# Patient Record
Sex: Female | Born: 1955
Health system: Southern US, Community
[De-identification: ages and names within clinical notes are randomized; demographics above are authoritative.]

## PROBLEM LIST (undated history)

## (undated) DIAGNOSIS — K589 Irritable bowel syndrome without diarrhea: Secondary | ICD-10-CM

## (undated) DIAGNOSIS — I341 Nonrheumatic mitral (valve) prolapse: Secondary | ICD-10-CM

## (undated) DIAGNOSIS — G43909 Migraine, unspecified, not intractable, without status migrainosus: Secondary | ICD-10-CM

## (undated) DIAGNOSIS — H8109 Meniere's disease, unspecified ear: Secondary | ICD-10-CM

## (undated) DIAGNOSIS — T7840XA Allergy, unspecified, initial encounter: Secondary | ICD-10-CM

## (undated) HISTORY — DX: Irritable bowel syndrome, unspecified: K58.9

## (undated) HISTORY — DX: Migraine, unspecified, not intractable, without status migrainosus: G43.909

## (undated) HISTORY — DX: Allergy, unspecified, initial encounter: T78.40XA

## (undated) HISTORY — DX: Nonrheumatic mitral (valve) prolapse: I34.1

## (undated) HISTORY — PX: BREAST SURGERY: SHX581

## (undated) HISTORY — DX: Meniere's disease, unspecified ear: H81.09

## (undated) HISTORY — PX: BREAST CYST ASPIRATION: SHX578

---

## 1997-07-08 HISTORY — PX: CHOLECYSTECTOMY: SHX55

## 2002-07-08 HISTORY — PX: COLONOSCOPY: SHX174

## 2004-05-10 ENCOUNTER — Ambulatory Visit: Payer: Self-pay | Admitting: Family Medicine

## 2004-08-09 ENCOUNTER — Ambulatory Visit: Payer: Self-pay | Admitting: Unknown Physician Specialty

## 2004-08-14 ENCOUNTER — Ambulatory Visit: Payer: Self-pay | Admitting: Unknown Physician Specialty

## 2005-09-18 ENCOUNTER — Ambulatory Visit: Payer: Self-pay | Admitting: General Surgery

## 2006-07-23 ENCOUNTER — Ambulatory Visit: Payer: Self-pay | Admitting: Unknown Physician Specialty

## 2007-09-03 ENCOUNTER — Ambulatory Visit: Payer: Self-pay | Admitting: Unknown Physician Specialty

## 2009-08-17 ENCOUNTER — Ambulatory Visit: Payer: Self-pay | Admitting: Unknown Physician Specialty

## 2010-08-20 ENCOUNTER — Ambulatory Visit: Payer: Self-pay | Admitting: Unknown Physician Specialty

## 2011-09-30 ENCOUNTER — Ambulatory Visit: Payer: Self-pay | Admitting: Family Medicine

## 2012-07-08 HISTORY — PX: BREAST EXCISIONAL BIOPSY: SUR124

## 2012-09-23 ENCOUNTER — Ambulatory Visit: Payer: Self-pay | Admitting: Family Medicine

## 2013-01-17 DIAGNOSIS — N63 Unspecified lump in unspecified breast: Secondary | ICD-10-CM | POA: Insufficient documentation

## 2013-02-05 ENCOUNTER — Ambulatory Visit: Payer: Self-pay | Admitting: Family Medicine

## 2013-02-08 ENCOUNTER — Encounter: Payer: Self-pay | Admitting: *Deleted

## 2013-02-17 ENCOUNTER — Ambulatory Visit (INDEPENDENT_AMBULATORY_CARE_PROVIDER_SITE_OTHER): Payer: BC Managed Care – PPO | Admitting: General Surgery

## 2013-02-17 ENCOUNTER — Encounter: Payer: Self-pay | Admitting: General Surgery

## 2013-02-17 VITALS — BP 100/72 | HR 76 | Resp 14 | Ht 66.0 in | Wt 143.0 lb

## 2013-02-17 DIAGNOSIS — N63 Unspecified lump in unspecified breast: Secondary | ICD-10-CM

## 2013-02-17 NOTE — Patient Instructions (Addendum)
Continue self breast exams. Call office for any new breast issues or concerns.  The left breast mass excision procedure was reviewed with the patient. The potential for bleeding, infection, and pain was reviewed. At this time, the benefits outweigh the risk and the patient is amenable to proceed. Patient to decide if office procedure or outpatient day surgery.  Discussed having a routine colonoscopy, last one was 2004.  Patient will call the office with decision regarding office excision versus having [facility] left breast mass excision.

## 2013-02-17 NOTE — Progress Notes (Signed)
Patient ID: Jill Ward, female   DOB: 1956-04-15, 57 y.o.   MRN: [id number]  Chief Complaint  Patient presents with  . Breast Problem    abnormal mammogram    HPI Jill Ward is a 57 y.o. female.  who presents for a breast evaluation. The most recent mammogram and ultrasound was done on 02-05-13.  Patient does perform regular self breast checks and gets regular mammograms done.  Family history of breast and ovarian cancer. States she has noticed left breast nipple discharge yellowish in color for about one month and has noticed a lump. States she has been genetically testing and is awaiting on results. She denies pain and injury.  No nipple oral contact.  HPI  Past Medical History  Diagnosis Date  . Irritable bowel syndrome (IBS)     Past Surgical History  Procedure Laterality Date  . Cholecystectomy  1999  . Breast surgery    . Breast cyst aspiration    . Colonoscopy      Dr Lemar Livings    Family History  Problem Relation Age of Onset  . Cancer Mother     bladder  . Dementia Father   . Crohn's disease Mother   . Breast cancer Maternal Aunt 40  . Ovarian cancer Maternal Aunt 77    Social History History  Substance Use Topics  . Smoking status: Never Smoker   . Smokeless tobacco: Not on file  . Alcohol Use: No    Allergies  Allergen Reactions  . Codeine Nausea Only    Current Outpatient Prescriptions  Medication Sig Dispense Refill  . Probiotic Product (PROBIOTIC DAILY PO) Take 1 tablet by mouth daily.      . citalopram (CELEXA) 10 MG tablet Take 10 mg by mouth daily.        No current facility-administered medications for this visit.    Review of Systems Review of Systems  Constitutional: Negative.   Respiratory: Negative.   Cardiovascular: Negative.     Blood pressure 100/72, pulse 76, resp. rate 14, height 5\' 6"  (1.676 m), weight 143 lb (64.864 kg).  Physical Exam Physical Exam  Constitutional: She is oriented to person, place, and time. She  appears well-developed and well-nourished.  Neck: Neck supple.  Cardiovascular: Normal rate and regular rhythm.   Pulmonary/Chest: Effort normal and breath sounds normal. Right breast exhibits no inverted nipple, no mass, no nipple discharge, no skin change and no tenderness. Left breast exhibits mass (5mm nodule base of nipple with single drop clear drainage). Left breast exhibits no inverted nipple, no nipple discharge (single drop of clear yellow drainage, single duct.), no skin change and no tenderness.  Lymphadenopathy:    She has no cervical adenopathy.    She has no axillary adenopathy.  Neurological: She is alert and oriented to person, place, and time.  Skin: Skin is warm and dry.      Data Reviewed Left breast mammogram dated 02/05/2013 showed prominent retroareolar duct. Ultrasound completed the same date suggested a 3 mm solid nodule at 11:00 which could be located within the duct. [id number].  Ductal fluid cytology dated 02/03/2013 showed negative for malignant cells. Foam cells and proteinaceous material was noted.  Focused ultrasound examination of the nodular area showed a cystlike structure, possibly associated with a adjacent ductal structure.  Assessment    Prominent retroareolar ductal tissue in the upper inner quadrant left breast. Mammographic abnormality correlates with clinical exam.    Plan    Local excision of this area  is recommended. As can be completed as an office procedure or under anesthesia in the hospital as an outpatient.  Excision has been recommended to clarify the diagnosis. The patient is to say the least somewhat anxious about the palpable nodule and the new drainage.  The patient's last colonoscopy was in 2004. A screening studies indicated. She reports that her IBS symptoms are best when she make use of a probiotic. She was encouraged to use his daily.     Patient will call the office with decision regarding office excision versus having [facility]  left breast mass excision.  Dorathy Daft M 02/17/2013, 9:19 AM

## 2013-02-22 ENCOUNTER — Telehealth: Payer: Self-pay

## 2013-02-22 NOTE — Telephone Encounter (Signed)
Patient returned call. She was notified of the need to sign consent and her prescription that Dr Lemar Livings wrote for her. Patient to come in tomorrow to sign consent and pick up her prescription.

## 2013-02-22 NOTE — Telephone Encounter (Signed)
Left msg for patient to call back

## 2013-02-22 NOTE — Telephone Encounter (Signed)
Message copied by Sinda Du on Mon Feb 22, 2013  1:35 PM ------      Message from: Poole, Merrily Pew      Created: Sun Feb 21, 2013  5:43 PM      Regarding: RE: Pre excision/biopsy medication       Patient will need to come in to sign consent prior to procedure. She can pick up RX for valium tablet to be taken one hour prior to the procedure at that time. RX in out box.       ----- Message -----         From: Sinda Du, LPN         Sent: 02/18/2013   3:24 PM           To: Earline Mayotte, MD      Subject: Pre excision/biopsy medication                           Alee called today to schedule her in office breast mass excision/biopsy for Monday the 25th. She was last seen on 02/17/13. She wanted to know if she could get some medication called in for anxiety/nervousness prior to her procedure. She said she would only need 2 or 3 low doses of either valium or xanax so she could be more relaxed that day. She will have someone driving her that day. I told her I would ask you and if there was any problems with that I would give her a call back.       ------

## 2013-03-01 ENCOUNTER — Ambulatory Visit (INDEPENDENT_AMBULATORY_CARE_PROVIDER_SITE_OTHER): Payer: BC Managed Care – PPO | Admitting: General Surgery

## 2013-03-01 ENCOUNTER — Encounter: Payer: Self-pay | Admitting: General Surgery

## 2013-03-01 VITALS — BP 122/78 | HR 77 | Resp 13 | Ht 66.0 in | Wt 141.0 lb

## 2013-03-01 DIAGNOSIS — N63 Unspecified lump in unspecified breast: Secondary | ICD-10-CM

## 2013-03-01 DIAGNOSIS — N6320 Unspecified lump in the left breast, unspecified quadrant: Secondary | ICD-10-CM

## 2013-03-01 NOTE — Patient Instructions (Addendum)
CARE AFTER BREAST  Excision  1. Leave the dressing on that your doctor applied after surgery. It is waterproof. You may bathe, shower and/or swim. The dressing will probably remain intact until your return office visit. If the dressing comes off, you will see small strips of tape against your skin on the incision. Do not remove these strips.  2. You may want to use a gauze,cloth or similar protection in your bra to prevent rubbing against your dressing and incision. This is not necessary, but you may feel more comfortable doing so.  3. It is recommended that you wear a bra day and night to give support to the breast. This will prevent the weight of the breast from pulling on the incision.  4. Your breast will feel hard and lumpy under the incision. Do not be alarmed. This is the underlying stitching of tissue. Softening of this tissue will occur in time.  5. Make sure you call the office and schedule an appointment in one week after your surgery. The office phone number is [phone]. The [facility] may have already done this for you.  6. You will notice about a week after your office visit that the strips of the tape on your incision will begin to loosen. These may then be removed.  7. Report to your doctor any of the following:  * Severe pain not relieved by your pain medication  *Redness of the incision  * Drainage from the incision  *Fever greater than 101 degrees 

## 2013-03-01 NOTE — Progress Notes (Signed)
Patient ID: Jill Ward, female   DOB: 1956-02-15, 57 y.o.   MRN: [id number]  Chief Complaint  Patient presents with  . Other    excision of L breast mass    HPI Jill Ward is a 57 y.o. female  Here for scheduled excision of left breast mass. The patient had made use of a 10 mg Valium tablet prior to the procedure. Informed consent had been obtained earlier this month. She did have a driver. The area of concern was confirmed by the patient in the upper inner area of the left retroareolar tissue. HPI  Past Medical History  Diagnosis Date  . Irritable bowel syndrome (IBS)     Past Surgical History  Procedure Laterality Date  . Cholecystectomy  1999  . Breast surgery    . Breast cyst aspiration    . Colonoscopy  2004    Dr Lemar Livings    Family History  Problem Relation Age of Onset  . Cancer Mother     bladder  . Dementia Father   . Crohn's disease Mother   . Breast cancer Maternal Aunt 40  . Ovarian cancer Maternal Aunt 77    Social History History  Substance Use Topics  . Smoking status: Never Smoker   . Smokeless tobacco: Not on file  . Alcohol Use: No    Allergies  Allergen Reactions  . Codeine Nausea Only    Current Outpatient Prescriptions  Medication Sig Dispense Refill  . citalopram (CELEXA) 10 MG tablet Take 10 mg by mouth daily.       . Probiotic Product (PROBIOTIC DAILY PO) Take 1 tablet by mouth daily.       No current facility-administered medications for this visit.    Review of Systems Review of Systems  Blood pressure 122/78, pulse 77, resp. rate 13, height 5\' 6"  (1.676 m), weight 141 lb (63.957 kg).  Physical Exam Physical Exam Examination showed a palpable area about 5 mm in diameter just at the edge of the areola. This was confirmed with ultrasound. Data Reviewed No new data.  Assessment    Left breast mass. Possible ductal dilatation.     Plan    The area was prepped with alcohol and 10 cc of 0.5% Xylocaine with 0.25%  Marcaine with 1-200,000 of epinephrine was utilized a well-tolerated. Chlor prep was applied to the skin. A small radial incision was made from the base of the nipple to the edge of the areola. The nodular tissue was excised sharply. The deep tissue was approximated with 3-0 Vicryl. The skin was approximated with interrupted 3-0 Vicryl subcutaneous tissue sutures. Scant bleeding was noted. The wound was closed with benzoin and Steri-Strips followed by Telfa and Tegaderm dressing. The procedure was well-tolerated.        Earline Mayotte 03/01/2013, 9:44 PM

## 2013-03-03 ENCOUNTER — Telehealth: Payer: Self-pay | Admitting: *Deleted

## 2013-03-03 LAB — PATHOLOGY

## 2013-03-03 NOTE — Telephone Encounter (Signed)
Notified patient as instructed, no cancer per Dr. Lemar Livings, patient pleased. Discussed follow-up appointments nurse next week, patient agrees

## 2013-03-09 ENCOUNTER — Ambulatory Visit (INDEPENDENT_AMBULATORY_CARE_PROVIDER_SITE_OTHER): Payer: BC Managed Care – PPO | Admitting: *Deleted

## 2013-03-09 DIAGNOSIS — N63 Unspecified lump in unspecified breast: Secondary | ICD-10-CM

## 2013-03-09 DIAGNOSIS — N6320 Unspecified lump in the left breast, unspecified quadrant: Secondary | ICD-10-CM | POA: Insufficient documentation

## 2013-03-09 NOTE — Progress Notes (Signed)
Steri strips in place. Some bruising present. No complaints at this time from patient. Patient advised of pathology results.

## 2014-05-09 ENCOUNTER — Encounter: Payer: Self-pay | Admitting: General Surgery

## 2014-07-08 HISTORY — PX: OTHER SURGICAL HISTORY: SHX169

## 2015-02-22 ENCOUNTER — Encounter (INDEPENDENT_AMBULATORY_CARE_PROVIDER_SITE_OTHER): Payer: Self-pay

## 2015-02-22 ENCOUNTER — Ambulatory Visit (INDEPENDENT_AMBULATORY_CARE_PROVIDER_SITE_OTHER): Payer: BLUE CROSS/BLUE SHIELD | Admitting: Nurse Practitioner

## 2015-02-22 ENCOUNTER — Encounter: Payer: Self-pay | Admitting: Nurse Practitioner

## 2015-02-22 VITALS — BP 120/78 | HR 78 | Temp 98.6°F | Resp 16 | Ht 66.0 in | Wt 151.6 lb

## 2015-02-22 DIAGNOSIS — G43809 Other migraine, not intractable, without status migrainosus: Secondary | ICD-10-CM | POA: Diagnosis not present

## 2015-02-22 DIAGNOSIS — Z91048 Other nonmedicinal substance allergy status: Secondary | ICD-10-CM | POA: Diagnosis not present

## 2015-02-22 DIAGNOSIS — Z9109 Other allergy status, other than to drugs and biological substances: Secondary | ICD-10-CM

## 2015-02-22 DIAGNOSIS — K589 Irritable bowel syndrome without diarrhea: Secondary | ICD-10-CM

## 2015-02-22 DIAGNOSIS — C801 Malignant (primary) neoplasm, unspecified: Secondary | ICD-10-CM

## 2015-02-22 DIAGNOSIS — Z7189 Other specified counseling: Secondary | ICD-10-CM | POA: Diagnosis not present

## 2015-02-22 DIAGNOSIS — Z7689 Persons encountering health services in other specified circumstances: Secondary | ICD-10-CM

## 2015-02-22 DIAGNOSIS — IMO0002 Reserved for concepts with insufficient information to code with codable children: Secondary | ICD-10-CM

## 2015-02-22 NOTE — Progress Notes (Signed)
Patient ID: Jill Ward, female    DOB: 1955/11/12  Age: 59 y.o. MRN: [id number]  CC: Establish Care   HPI ASENCION GUISINGER presents for establishing care and CC of swollen eyes and stopped up ears.   1) New pt info:   Immunizations- Tdap 2015; Zostavax in 2014  Mammogram- 08/2014   Pap- 2/16   Colonoscopy- 10 years ago, has IBS   Eye Exam- 02/27/15   2) Chronic Problems-  Allergies- Claritin OTC during spring   Migraines- Stress induced; last episode 2 years ago   IBS- diarrhea: Probiotic helpful (Colon health)   Squamous cell- on chest; removed   Dermatology in [place]. [place] Dr. Wylene Simmer   3) Acute Problems-   Filmy all day she reports, leaking, ongoing for weeks,   Alivert and eye drops   Warm compresses- not helpful   Ears are stopped up   History Rachana has a past medical history of Irritable bowel syndrome (IBS); Allergy; and Migraines.   She has past surgical history that includes Cholecystectomy (1999); Breast surgery; Breast cyst aspiration; Colonoscopy (2004); and Sqamous Cancer Cell (2016).   Her family history includes Breast cancer (age of onset: 42) in her maternal aunt; Cancer (age of onset: 30) in her mother; Crohn's disease in her mother; Dementia in her father; Heart disease (age of onset: 89) in her father; Ovarian cancer (age of onset: 67) in her maternal aunt.She reports that she has never smoked. She has never used smokeless tobacco. She reports that she does not drink alcohol or use illicit drugs.  Outpatient Prescriptions Prior to Visit  Medication Sig Dispense Refill  . citalopram (CELEXA) 10 MG tablet Take 10 mg by mouth daily.     . Probiotic Product (PROBIOTIC DAILY PO) Take 1 tablet by mouth daily.     No facility-administered medications prior to visit.    ROS Review of Systems  Constitutional: Negative for fever, chills, diaphoresis and fatigue.  HENT: Negative for ear discharge and ear pain.        Ears feel congested  Eyes: Positive for  discharge. Negative for photophobia, pain, redness, itching and visual disturbance.  Respiratory: Negative for chest tightness, shortness of breath and wheezing.   Cardiovascular: Negative for chest pain, palpitations and leg swelling.  Gastrointestinal: Negative for nausea, vomiting and diarrhea.  Skin: Negative for rash.  Neurological: Negative for dizziness, weakness, numbness and headaches.  Psychiatric/Behavioral: The patient is not nervous/anxious.     Objective:  BP 120/78 mmHg  Pulse 78  Temp(Src) 98.6 F (37 C)  Resp 16  Ht 5\' 6"  (1.676 m)  Wt 151 lb 9.6 oz (68.765 kg)  BMI 24.48 kg/m2  SpO2 95%  Physical Exam  Constitutional: She is oriented to person, place, and time. She appears well-developed and well-nourished. No distress.  HENT:  Head: Normocephalic and atraumatic.  Right Ear: External ear normal.  Left Ear: External ear normal.  Eyes: Conjunctivae and EOM are normal. Pupils are equal, round, and reactive to light. Right eye exhibits no discharge. Left eye exhibits no discharge. No scleral icterus.  No visible significant findings; left top eyelid has small round flesh colored lesion  Neck: Normal range of motion. Neck supple.  Cardiovascular: Normal rate, regular rhythm and normal heart sounds.   Pulmonary/Chest: Effort normal and breath sounds normal. No respiratory distress. She has no wheezes. She has no rales. She exhibits no tenderness.  Lymphadenopathy:    She has no cervical adenopathy.  Neurological: She is alert and  oriented to person, place, and time. No cranial nerve deficit. She exhibits normal muscle tone. Coordination normal.  Skin: Skin is warm and dry. No rash noted. She is not diaphoretic.  Psychiatric: She has a normal mood and affect. Her behavior is normal. Judgment and thought content normal.   Assessment & Plan:   Briany was seen today for establish care.  Diagnoses and all orders for this visit:  Environmental allergies  Encounter  to establish care  IBS (irritable bowel syndrome)  Other migraine without status migrainosus, not intractable  Squamous cell carcinoma  I am having Ms. Rae maintain her citalopram, Probiotic Product (PROBIOTIC DAILY PO), and cholecalciferol.  Meds ordered this encounter  Medications  . cholecalciferol (VITAMIN D) 1000 UNITS tablet    Sig: Take 1,000 Units by mouth daily.     Follow-up: Return in about 6 months (around 08/25/2015) for Follow up.

## 2015-02-22 NOTE — Patient Instructions (Signed)
Please use flonase 2 sprays in each nostril once daily   Welcome to [facility]! Call us next week if not helpful.  Follow up in 6 months.

## 2015-02-23 DIAGNOSIS — K589 Irritable bowel syndrome without diarrhea: Secondary | ICD-10-CM | POA: Insufficient documentation

## 2015-02-23 DIAGNOSIS — IMO0002 Reserved for concepts with insufficient information to code with codable children: Secondary | ICD-10-CM | POA: Insufficient documentation

## 2015-02-23 DIAGNOSIS — Z0000 Encounter for general adult medical examination without abnormal findings: Secondary | ICD-10-CM | POA: Insufficient documentation

## 2015-02-23 DIAGNOSIS — Z7689 Persons encountering health services in other specified circumstances: Secondary | ICD-10-CM | POA: Insufficient documentation

## 2015-02-23 DIAGNOSIS — Z9109 Other allergy status, other than to drugs and biological substances: Secondary | ICD-10-CM | POA: Insufficient documentation

## 2015-02-23 DIAGNOSIS — G43909 Migraine, unspecified, not intractable, without status migrainosus: Secondary | ICD-10-CM | POA: Insufficient documentation

## 2015-02-23 NOTE — Assessment & Plan Note (Signed)
Pt reports no recent episodes in last 2 yrs. Pt rests and has a phenergan suppository for nausea relief. Stress is a factor.

## 2015-02-23 NOTE — Assessment & Plan Note (Signed)
Pt has a dermatologist in [place]. [place] that she visits. 1 spot removed from chest was squamous cell. She has recently had the all clear for 1 year.

## 2015-02-23 NOTE — Assessment & Plan Note (Addendum)
Pt reports allergic symptoms. She is seeing her eye Dr. This week. TMs clear bilaterally. Asked pt to try 2 sprays of Flonase daily. FU prn worsening/failure to improve.

## 2015-02-23 NOTE — Assessment & Plan Note (Signed)
Discussed acute and chronic issues. Reviewed health maintenance measures, PFSHx, and immunizations. Obtain records from previous facility.   

## 2015-02-23 NOTE — Assessment & Plan Note (Signed)
Pt is stable with IBS-diarrhea. Pt finds colon health probiotics helpful.

## 2015-10-12 ENCOUNTER — Encounter: Payer: Self-pay | Admitting: Nurse Practitioner

## 2015-10-12 ENCOUNTER — Ambulatory Visit (INDEPENDENT_AMBULATORY_CARE_PROVIDER_SITE_OTHER): Payer: Self-pay | Admitting: Nurse Practitioner

## 2015-10-12 VITALS — BP 114/78 | HR 71 | Temp 98.0°F | Ht 66.0 in | Wt 150.5 lb

## 2015-10-12 DIAGNOSIS — N63 Unspecified lump in unspecified breast: Secondary | ICD-10-CM

## 2015-10-12 DIAGNOSIS — R05 Cough: Secondary | ICD-10-CM

## 2015-10-12 DIAGNOSIS — Z1239 Encounter for other screening for malignant neoplasm of breast: Secondary | ICD-10-CM

## 2015-10-12 DIAGNOSIS — R059 Cough, unspecified: Secondary | ICD-10-CM

## 2015-10-12 MED ORDER — SCOPOLAMINE 1 MG/3DAYS TD PT72: 1.0000 | MEDICATED_PATCH | TRANSDERMAL | Status: DC

## 2015-10-12 MED ORDER — METHYLPREDNISOLONE ACETATE 40 MG/ML IJ SUSP
40.0000 mg | Freq: Once | INTRAMUSCULAR | Status: AC
  Administered 2015-10-12: 40 mg via INTRAMUSCULAR

## 2015-10-12 MED ORDER — AMOXICILLIN-POT CLAVULANATE 875-125 MG PO TABS: 1.0000 | ORAL_TABLET | Freq: Two times a day (BID) | ORAL | Status: DC

## 2015-10-12 NOTE — Progress Notes (Signed)
Patient ID: Jill Ward, female    DOB: January 20, 1956  Age: 60 y.o. MRN: [id number]  CC: Cough; Nasal Congestion; Sinusitis; Facial Pain; and can't smell anything   HPI Jill Ward presents for CC of sinusitis x 7 days.   1) Cough, congestion, sinusitis, facial pain, decreased smell  Denies sick contacts Treatment to date:  Sudafed and Ibuprofen   Saw ENT and diagnosed with menieres recently  Diuretic- 2 rounds- not helpful  Prednisone- helpful   Dr. Debe CoderCarlis Ward ENT - possible referral in the future   History Jill Ward has a past medical history of Irritable bowel syndrome (IBS); Allergy; Migraines; and Meniere's disease.   She has past surgical history that includes Cholecystectomy (1999); Breast surgery; Breast cyst aspiration; Colonoscopy (2004); and Sqamous Cancer Cell (2016).   Her family history includes Breast cancer (age of onset: 50) in her maternal aunt; Cancer (age of onset: 27) in her mother; Crohn's disease in her mother; Dementia in her father; Heart disease (age of onset: 78) in her father; Ovarian cancer (age of onset: 64) in her maternal aunt.She reports that she has never smoked. She has never used smokeless tobacco. She reports that she does not drink alcohol or use illicit drugs.  Outpatient Prescriptions Prior to Visit  Medication Sig Dispense Refill  . cholecalciferol (VITAMIN D) 1000 UNITS tablet Take 1,000 Units by mouth daily.    . citalopram (CELEXA) 10 MG tablet Take 10 mg by mouth daily.     . Probiotic Product (PROBIOTIC DAILY PO) Take 1 tablet by mouth daily.     No facility-administered medications prior to visit.    ROS Review of Systems  Constitutional: Positive for fatigue. Negative for fever, chills and diaphoresis.  HENT: Positive for congestion, postnasal drip, rhinorrhea, sinus pressure, sore throat and tinnitus. Negative for ear pain, facial swelling, hearing loss, mouth sores and nosebleeds.   Respiratory: Positive for cough. Negative  for chest tightness, shortness of breath and wheezing.   Cardiovascular: Negative for chest pain, palpitations and leg swelling.  Gastrointestinal: Negative for nausea, vomiting and diarrhea.  Skin: Negative for rash.  Neurological: Positive for dizziness. Negative for headaches.    Objective:  BP 114/78 mmHg  Pulse 71  Temp(Src) 98 F (36.7 C) (Oral)  Ht 5\' 6"  (1.676 m)  Wt 150 lb 8 oz (68.266 kg)  BMI 24.30 kg/m2  SpO2 97%  Physical Exam  Constitutional: She is oriented to person, place, and time. She appears well-developed and well-nourished. No distress.  HENT:  Head: Normocephalic and atraumatic.  Right Ear: External ear normal.  Left Ear: External ear normal.  Mouth/Throat: Oropharynx is clear and moist. No oropharyngeal exudate.  TMs clear bilaterally Boggy turbinates  Eyes: EOM are normal. Pupils are equal, round, and reactive to light. Right eye exhibits no discharge. Left eye exhibits no discharge. No scleral icterus.  Neck: Normal range of motion. Neck supple.  Cardiovascular: Normal rate and regular rhythm.   Pulmonary/Chest: Effort normal and breath sounds normal. No respiratory distress. She has no wheezes. She has no rales. She exhibits no tenderness.  Lymphadenopathy:    She has no cervical adenopathy.  Neurological: She is alert and oriented to person, place, and time.  Skin: Skin is warm and dry. No rash noted. She is not diaphoretic.  Psychiatric: She has a normal mood and affect. Her behavior is normal. Judgment and thought content normal.   Assessment & Plan:   Jill Ward was seen today for cough, nasal congestion, sinusitis, facial  pain and can't smell anything.  Diagnoses and all orders for this visit:  Screening for breast cancer -     MM Digital Screening; Future  Cough -     methylPREDNISolone acetate (DEPO-MEDROL) injection 40 mg; Inject 1 mL (40 mg total) into the muscle once.  Breast lump in female  Other orders -     amoxicillin-clavulanate  (AUGMENTIN) 875-125 MG tablet; Take 1 tablet by mouth 2 (two) times daily. -     scopolamine (TRANSDERM-SCOP) 1 MG/3DAYS; Place 1 patch (1.5 mg total) onto the skin every 3 (three) days.  I am having Jill Ward start on amoxicillin-clavulanate and scopolamine. I am also having her maintain her citalopram, Probiotic Product (PROBIOTIC DAILY PO), cholecalciferol, and vitamin B-12. We administered methylPREDNISolone acetate.  Meds ordered this encounter  Medications  . vitamin B-12 (CYANOCOBALAMIN) 100 MCG tablet    Sig: Take 100 mcg by mouth daily.  Marland Kitchen amoxicillin-clavulanate (AUGMENTIN) 875-125 MG tablet    Sig: Take 1 tablet by mouth 2 (two) times daily.    Dispense:  14 tablet    Refill:  0    Order Specific Question:  Supervising Provider    Answer:  Deborra Medina L [[id number]]  . methylPREDNISolone acetate (DEPO-MEDROL) injection 40 mg    Sig:   . scopolamine (TRANSDERM-SCOP) 1 MG/3DAYS    Sig: Place 1 patch (1.5 mg total) onto the skin every 3 (three) days.    Dispense:  10 patch    Refill:  1    Order Specific Question:  Supervising Provider    Answer:  Crecencio Mc [[id number]]     Follow-up: Return if symptoms worsen or fail to improve, for Needs CPE .

## 2015-10-12 NOTE — Progress Notes (Signed)
Pre visit review using our clinic review tool, if applicable. No additional management support is needed unless otherwise documented below in the visit note. 

## 2015-10-12 NOTE — Patient Instructions (Addendum)
[facility] at [facility]  Address: [address] [place],  [address]  Phone: [phone] Hours:  Monday - Thursday: 8 a.m. - 5 p.m. Friday: 8 a.m. - 3 p.m.  Depo-medrol- it will decrease inflammation over the next 24 hrs Alavert (5 mg twice daily or 10 mg once daily) Augmentin - probiotics or yogurt for stomach protection  Safe travels!

## 2015-10-17 DIAGNOSIS — R059 Cough, unspecified: Secondary | ICD-10-CM | POA: Insufficient documentation

## 2015-10-17 DIAGNOSIS — R051 Acute cough: Secondary | ICD-10-CM | POA: Insufficient documentation

## 2015-10-17 DIAGNOSIS — R05 Cough: Secondary | ICD-10-CM | POA: Insufficient documentation

## 2015-10-17 NOTE — Assessment & Plan Note (Signed)
Screening for breast cancer order placed

## 2015-10-17 NOTE — Assessment & Plan Note (Signed)
New Onset Due to length of symptoms with worsening will treat empirically  Augmentin was sent to the pharmacy [facility] Depo-Medrol 40 mg IM today Continue OTC measures  FU prn worsening/failure to improve.

## 2015-10-26 ENCOUNTER — Other Ambulatory Visit: Payer: Self-pay | Admitting: Nurse Practitioner

## 2015-10-26 DIAGNOSIS — Z1239 Encounter for other screening for malignant neoplasm of breast: Secondary | ICD-10-CM

## 2015-11-09 ENCOUNTER — Ambulatory Visit: Payer: Self-pay

## 2015-12-07 ENCOUNTER — Ambulatory Visit: Payer: Self-pay

## 2016-03-18 ENCOUNTER — Other Ambulatory Visit: Payer: Self-pay | Admitting: Family

## 2016-03-25 ENCOUNTER — Telehealth: Payer: Self-pay | Admitting: *Deleted

## 2016-03-25 ENCOUNTER — Other Ambulatory Visit: Payer: Self-pay | Admitting: Family

## 2016-03-25 NOTE — Telephone Encounter (Signed)
Pt has requested a medication refill for celexa  Pt contact [phone] [facility]

## 2016-03-25 NOTE — Telephone Encounter (Signed)
Historical medication. Has not established but has Arnett has PCP. Last seen by Morey Hummingbird on 10/12/2015 please advise?

## 2016-03-25 NOTE — Telephone Encounter (Signed)
This is pended to the provider already, see additional refill note. thanks

## 2016-03-26 NOTE — Telephone Encounter (Signed)
Forwarding to PCP.

## 2016-03-26 NOTE — Telephone Encounter (Signed)
Please call patient and advise her to make sooner appointment for Celexa. She hasn't been on this medication since 01/2013 and I want to ensure that she is safe on this medication since it has been quite some time since she has taken.

## 2016-03-26 NOTE — Telephone Encounter (Signed)
Left a detailed message stating that she needs to schedule appointment closer to establish care for refill of Celexa.

## 2016-04-02 ENCOUNTER — Encounter: Payer: Self-pay | Admitting: Family

## 2016-04-02 ENCOUNTER — Ambulatory Visit (INDEPENDENT_AMBULATORY_CARE_PROVIDER_SITE_OTHER): Payer: Self-pay | Admitting: Family

## 2016-04-02 VITALS — BP 106/60 | HR 74 | Temp 97.7°F | Wt 148.6 lb

## 2016-04-02 DIAGNOSIS — Z7189 Other specified counseling: Secondary | ICD-10-CM

## 2016-04-02 DIAGNOSIS — M1990 Unspecified osteoarthritis, unspecified site: Secondary | ICD-10-CM | POA: Insufficient documentation

## 2016-04-02 DIAGNOSIS — M150 Primary generalized (osteo)arthritis: Secondary | ICD-10-CM

## 2016-04-02 DIAGNOSIS — Z7689 Persons encountering health services in other specified circumstances: Secondary | ICD-10-CM

## 2016-04-02 DIAGNOSIS — F32A Depression, unspecified: Secondary | ICD-10-CM

## 2016-04-02 DIAGNOSIS — M159 Polyosteoarthritis, unspecified: Secondary | ICD-10-CM

## 2016-04-02 DIAGNOSIS — Z1239 Encounter for other screening for malignant neoplasm of breast: Secondary | ICD-10-CM

## 2016-04-02 DIAGNOSIS — F329 Major depressive disorder, single episode, unspecified: Secondary | ICD-10-CM

## 2016-04-02 MED ORDER — DICLOFENAC SODIUM 1 % TD GEL: 4.0000 g | Freq: Four times a day (QID) | TRANSDERMAL | 3 refills | Status: DC

## 2016-04-02 MED ORDER — CITALOPRAM HYDROBROMIDE 10 MG PO TABS: 10.0000 mg | ORAL_TABLET | Freq: Every day | ORAL | 3 refills | Status: DC

## 2016-04-02 NOTE — Assessment & Plan Note (Signed)
Stable. Chronic. Advised to limit NSAIDs. Trial of voltaren gel, capsaicin. Discussed possible MTP and MCP injections with ortho or podiatry if worsens. Also discussed possible Cymbalta and stopped Celexa.

## 2016-04-02 NOTE — Assessment & Plan Note (Signed)
Reviewed past medical, surgical, and social history. Ordered screening labs today for CPE and Pap in one month. Discussed that patient would like to consider Cologuard versus colonoscopy since she does not have insurance at this time. Scheduled patient for mammogram.

## 2016-04-02 NOTE — Assessment & Plan Note (Addendum)
Stable. Continue current regimen. Medication refilled. Discussed potentially trying Cymbalta in future for depression and OA.

## 2016-04-02 NOTE — Patient Instructions (Addendum)
Trial of voltaren gel.  See you for your physical.  Let's treat conservatively as we discussed.   Over-the-counter medications you may try for arthritic pain include:   ThermaCare patches   Capsaicin cream   Icy hot  Home exercises as we discussed below/handout.  If conservative treatment doesn't yield results, we will consider physical therapy, consult to Sports Medicine/Orthopedics for further evaluation, and imaging.   If there is no improvement in your symptoms, or if there is any worsening of symptoms, or if you have any additional concerns, please return for re-evaluation; or, if we are closed, consider going to the Emergency Room for evaluation if symptoms urgent.

## 2016-04-02 NOTE — Progress Notes (Signed)
Pre visit review using our clinic review tool, if applicable. No additional management support is needed unless otherwise documented below in the visit note. 

## 2016-04-02 NOTE — Progress Notes (Signed)
Subjective:    Patient ID: Jill Ward, female    DOB: 31-Dec-1955, 60 y.o.   MRN: [id number]  CC: Jill Ward is a 60 y.o. female who presents today for follow up.   HPI: Patient here for follow-up visit for depression and would like a refill on her medication. Established with Jill Ward 02/2015. No records of colonoscopy or mamogram from [place]. She is also here to establish care.  Depression- Tried Prozac which didn't work. Celexa has worked very well. Stabilizing for her. Financially stressed. Notes marital stress as well. No thoughts of hurting herself or anyone else.  OA in hands and feet- Chronic. has been taking ibuprofen TID. No increased warmth, swelling. Takes a warm bath with relief. Pain mainly in bilateral great toes. No h/o GIB.  Coming back for CPE 05/2016.     HISTORY:  Past Medical History:  Diagnosis Date  . Allergy    Seasonal  . Irritable bowel syndrome (IBS)   . Meniere's disease   . Migraines    Past Surgical History:  Procedure Laterality Date  . BREAST CYST ASPIRATION    . BREAST SURGERY    . CHOLECYSTECTOMY  1999  . COLONOSCOPY  2004   Dr Bary Castilla  . Sqamous Cancer Cell  2016   On chest   Family History  Problem Relation Age of Onset  . Cancer Mother 83    bladder  . Crohn's disease Mother   . Dementia Father   . Heart disease Father 43    Heart Attack  . Breast cancer Maternal Aunt 5  . Ovarian cancer Maternal Aunt 52    Allergies: Codeine Current Outpatient Prescriptions on File Prior to Visit  Medication Sig Dispense Refill  . cholecalciferol (VITAMIN D) 1000 UNITS tablet Take 1,000 Units by mouth daily.    . Probiotic Product (PROBIOTIC DAILY PO) Take 1 tablet by mouth daily.    Marland Kitchen scopolamine (TRANSDERM-SCOP) 1 MG/3DAYS Place 1 patch (1.5 mg total) onto the skin every 3 (three) days. 10 patch 1  . vitamin B-12 (CYANOCOBALAMIN) 100 MCG tablet Take 100 mcg by mouth daily.     No current facility-administered  medications on file prior to visit.     Social History  Substance Use Topics  . Smoking status: Never Smoker  . Smokeless tobacco: Never Used  . Alcohol use No    Review of Systems  Constitutional: Negative for chills and fever.  Respiratory: Negative for cough.   Cardiovascular: Negative for chest pain and palpitations.  Gastrointestinal: Negative for nausea and vomiting.  Musculoskeletal: Positive for arthralgias. Negative for gait problem and myalgias.  Psychiatric/Behavioral: Negative for self-injury. The patient is not nervous/anxious.       Objective:    BP 106/60   Pulse 74   Temp 97.7 F (36.5 C) (Oral)   Wt 148 lb 9.6 oz (67.4 kg)   SpO2 98%   BMI 23.98 kg/m  BP Readings from Last 3 Encounters:  04/02/16 106/60  10/12/15 114/78  02/22/15 120/78   Wt Readings from Last 3 Encounters:  04/02/16 148 lb 9.6 oz (67.4 kg)  10/12/15 150 lb 8 oz (68.3 kg)  02/22/15 151 lb 9.6 oz (68.8 kg)    Physical Exam  Constitutional: She appears well-developed and well-nourished.  Eyes: Conjunctivae are normal.  Neck: No thyroid mass and no thyromegaly present.  Cardiovascular: Normal rate, regular rhythm, normal heart sounds and normal pulses.   Pulmonary/Chest: Effort normal and breath sounds  normal. She has no wheezes. She has no rhonchi. She has no rales.  Lymphadenopathy:       Head (right side): No submental, no submandibular, no tonsillar, no preauricular, no posterior auricular and no occipital adenopathy present.       Head (left side): No submental, no submandibular, no tonsillar, no preauricular, no posterior auricular and no occipital adenopathy present.    She has no cervical adenopathy.  Neurological: She is alert.  Skin: Skin is warm and dry.  Psychiatric: She has a normal mood and affect. Her speech is normal and behavior is normal. Thought content normal.  Vitals reviewed.      Assessment & Plan:   Problem List Items Addressed This Visit       Musculoskeletal and Integument   Osteoarthritis    Stable. Chronic. Advised to limit NSAIDs. Trial of voltaren gel, capsaicin. Discussed possible MTP and MCP injections with ortho or podiatry if worsens. Also discussed possible Cymbalta and stopped Celexa.       Relevant Medications   diclofenac sodium (VOLTAREN) 1 % GEL     Other   Encounter to establish care - Primary    Reviewed past medical, surgical, and social history. Ordered screening labs today for CPE and Pap in one month. Discussed that patient would like to consider Cologuard versus colonoscopy since she does not have insurance at this time. Scheduled patient for mammogram.       Relevant Orders   CBC with Differential/Platelet   Comprehensive metabolic panel   Hemoglobin A1c   Lipid panel   TSH   VITAMIN D 25 Hydroxy (Vit-D Deficiency, Fractures)   Depression    Stable. Continue current regimen. Medication refilled. Discussed potentially trying Cymbalta in future for depression and OA.      Relevant Medications   citalopram (CELEXA) 10 MG tablet    Other Visit Diagnoses    Screening for breast cancer       Relevant Orders   MM DIGITAL SCREENING BILATERAL       I have discontinued Ms. Mazzola's amoxicillin-clavulanate. I have also changed her citalopram. Additionally, I am having her start on diclofenac sodium. Lastly, I am having her maintain her Probiotic Product (PROBIOTIC DAILY PO), cholecalciferol, vitamin B-12, and scopolamine.   Meds ordered this encounter  Medications  . citalopram (CELEXA) 10 MG tablet    Sig: Take 1 tablet (10 mg total) by mouth daily.    Dispense:  90 tablet    Refill:  3    Order Specific Question:   Supervising Provider    Answer:   Deborra Medina L [[id number]]  . diclofenac sodium (VOLTAREN) 1 % GEL    Sig: Apply 4 g topically 4 (four) times daily.    Dispense:  1 Tube    Refill:  3    Order Specific Question:   Supervising Provider    Answer:   Crecencio Mc [[id number]]    Return  precautions given.   Risks, benefits, and alternatives of the medications and treatment plan prescribed today were discussed, and patient expressed understanding.   Education regarding symptom management and diagnosis given to patient on AVS.  Continue to follow with Mable Paris, FNP for routine health maintenance.   Vickii Chafe and I agreed with plan.   Mable Paris, FNP

## 2016-05-09 ENCOUNTER — Encounter: Payer: Self-pay | Admitting: Family

## 2016-06-03 ENCOUNTER — Ambulatory Visit: Payer: Self-pay | Admitting: Family

## 2016-11-26 ENCOUNTER — Encounter: Payer: Self-pay | Admitting: Family Medicine

## 2016-11-26 ENCOUNTER — Ambulatory Visit (INDEPENDENT_AMBULATORY_CARE_PROVIDER_SITE_OTHER): Payer: Self-pay | Admitting: Family Medicine

## 2016-11-26 DIAGNOSIS — H1033 Unspecified acute conjunctivitis, bilateral: Secondary | ICD-10-CM

## 2016-11-26 DIAGNOSIS — H109 Unspecified conjunctivitis: Secondary | ICD-10-CM | POA: Insufficient documentation

## 2016-11-26 MED ORDER — OLOPATADINE HCL 0.2 % OP SOLN: OPHTHALMIC | 0 refills | Status: DC

## 2016-11-26 NOTE — Assessment & Plan Note (Signed)
New problem. Suspect allergic in nature given the fact that she's had persistent symptoms for 3 weeks and no improvement with antibiotic treatment. Treating with Pataday. Arranging her to see ophthalmology for slit lamp examination/further evaluation.

## 2016-11-26 NOTE — Patient Instructions (Signed)
Eye drop once a day.  We will call with the eye appt.  Take care  Dr. Lacinda Axon

## 2016-11-26 NOTE — Progress Notes (Signed)
   Subjective:  Patient ID: LAURELL COALSON, female    DOB: 08-Oct-1955  Age: 61 y.o. MRN: [id number]  CC: Eye redness/itching/drainage  HPI:  61 year old female presents with the above complaints.  Patient reports a 3 week history of bilateral eye redness, itching, and drainage. Mild photophobia. She has a foreign body sensation. She has been prescribed moxifloxacin eyedrops and has also used some over-the-counter eyedrops without significant improvement. She states she's been using the antibiotic eyedrop for the past week. She states that her eyes constantly water. Crusted shut in the morning. No significant pain. No vision changes. No other associated symptoms. No other complaints or concerns at this time.  Social Hx   Social History   Social History  . Marital status: Married    Spouse name: N/A  . Number of children: N/A  . Years of education: N/A   Social History Main Topics  . Smoking status: Never Smoker  . Smokeless tobacco: Never Used  . Alcohol use No  . Drug use: No  . Sexual activity: No   Other Topics Concern  . None   Social History Narrative   Retired Aeronautical engineer    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, has recently closed down   Lives with husband and watches her grandchildren   4 children   Pets: None   Caffeine- 3 cans of diet coke     Review of Systems  Constitutional: Negative.   Eyes: Positive for photophobia, discharge, redness and itching.   Objective:  BP 100/72 (BP Location: Left Arm, Patient Position: Sitting, Cuff Size: Normal)   Pulse 98   Temp 97.8 F (36.6 C) (Oral)   Wt 154 lb (69.9 kg)   BMI 24.86 kg/m   BP/Weight 11/26/2016 2/83/6629 10/12/6544  Systolic BP 503 546 568  Diastolic BP 72 60 78  Wt. (Lbs) 154 148.6 150.5  BMI 24.86 23.98 24.3   Physical Exam  Constitutional: She is oriented to person, place, and time. She appears well-developed. No distress.  HENT:  Head: Normocephalic and atraumatic.  Eyes: Pupils are equal, round,  and reactive to light.  Conjunctival injection and drainage noted bilaterally. PERRLA. No photophobia with exam.  Cardiovascular: Normal rate and regular rhythm.   Pulmonary/Chest: Effort normal and breath sounds normal.  Neurological: She is alert and oriented to person, place, and time.  Psychiatric: She has a normal mood and affect.  Vitals reviewed.     Assessment & Plan:   Problem List Items Addressed This Visit    Conjunctivitis    New problem. Suspect allergic in nature given the fact that she's had persistent symptoms for 3 weeks and no improvement with antibiotic treatment. Treating with Pataday. Arranging her to see ophthalmology for slit lamp examination/further evaluation.      Relevant Orders   Ambulatory referral to Ophthalmology      Meds ordered this encounter  Medications  . loratadine (CLARITIN) 10 MG tablet    Sig: Take 10 mg by mouth daily.  . Olopatadine HCl 0.2 % SOLN    Sig: 1 drop to each eye daily.    Dispense:  2.5 mL    Refill:  0     Follow-up: PRN  [place]

## 2016-12-10 ENCOUNTER — Other Ambulatory Visit: Payer: Self-pay | Admitting: Family Medicine

## 2017-05-14 ENCOUNTER — Ambulatory Visit
Admission: RE | Admit: 2017-05-14 | Discharge: 2017-05-14 | Disposition: A | Discharge status: Home / Self Care | Payer: Self-pay | Source: Ambulatory Visit | Attending: Oncology | Admitting: Oncology

## 2017-05-14 ENCOUNTER — Encounter (INDEPENDENT_AMBULATORY_CARE_PROVIDER_SITE_OTHER): Payer: Self-pay

## 2017-05-14 ENCOUNTER — Ambulatory Visit: Payer: Self-pay | Attending: Oncology

## 2017-05-14 VITALS — BP 110/69 | HR 76 | Temp 98.2°F | Resp 18 | Ht 66.0 in | Wt 149.0 lb

## 2017-05-14 DIAGNOSIS — Z0000 Encounter for general adult medical examination without abnormal findings: Secondary | ICD-10-CM

## 2017-05-14 NOTE — Progress Notes (Signed)
Subjective:     Patient ID: Jill Ward, female   DOB: 1956-05-21, 61 y.o.   MRN: [id number]  HPI   Review of Systems     Objective:   Physical Exam  Pulmonary/Chest: Right breast exhibits no inverted nipple, no mass, no nipple discharge, no skin change and no tenderness. Left breast exhibits no inverted nipple, no mass, no nipple discharge, no skin change and no tenderness. Breasts are symmetrical.       Assessment:     61 year old patient presents for [facility] clinic visit.  Patient screened, and meets BCCCP eligibility.  Patient does not have insurance, Medicare or Medicaid.  Handout given on Affordable Care Act.  Instructed patient on breast self-exam using teach back method.  CBE unremarkable.  No mass or lump palpated.  Patient has 4 children.    Plan:     Sent for bilateral screening mammogram.

## 2017-05-15 ENCOUNTER — Other Ambulatory Visit: Payer: Self-pay | Admitting: Family

## 2017-05-15 DIAGNOSIS — F329 Major depressive disorder, single episode, unspecified: Secondary | ICD-10-CM

## 2017-05-15 DIAGNOSIS — F32A Depression, unspecified: Secondary | ICD-10-CM

## 2017-05-19 ENCOUNTER — Other Ambulatory Visit: Payer: Self-pay | Admitting: *Deleted

## 2017-05-19 ENCOUNTER — Inpatient Hospital Stay
Admission: RE | Admit: 2017-05-19 | Discharge: 2017-05-19 | Disposition: A | Discharge status: Home / Self Care | Payer: Self-pay | Source: Ambulatory Visit | Attending: *Deleted | Admitting: *Deleted

## 2017-05-19 DIAGNOSIS — Z9289 Personal history of other medical treatment: Secondary | ICD-10-CM

## 2017-06-02 NOTE — Progress Notes (Signed)
Letter mailed from [facility] to notify of normal mammogram results.  Patient to return in one year for annual screening.  Copy to [facility]. 

## 2018-10-12 ENCOUNTER — Telehealth: Payer: Self-pay

## 2018-10-12 NOTE — Telephone Encounter (Signed)
Unable to leave VM due to phone ringing. PEC may give and obtain information.   Patient needs to schedule appointment.

## 2018-11-10 ENCOUNTER — Other Ambulatory Visit: Payer: Self-pay | Admitting: Family

## 2018-11-10 DIAGNOSIS — F329 Major depressive disorder, single episode, unspecified: Secondary | ICD-10-CM

## 2018-11-10 DIAGNOSIS — F32A Depression, unspecified: Secondary | ICD-10-CM

## 2018-11-13 ENCOUNTER — Encounter: Payer: Self-pay | Admitting: Family

## 2018-11-13 ENCOUNTER — Other Ambulatory Visit: Payer: Self-pay

## 2018-11-13 ENCOUNTER — Ambulatory Visit (INDEPENDENT_AMBULATORY_CARE_PROVIDER_SITE_OTHER): Payer: Self-pay | Admitting: Family

## 2018-11-13 DIAGNOSIS — F329 Major depressive disorder, single episode, unspecified: Secondary | ICD-10-CM

## 2018-11-13 DIAGNOSIS — F32A Depression, unspecified: Secondary | ICD-10-CM

## 2018-11-13 DIAGNOSIS — Z1239 Encounter for other screening for malignant neoplasm of breast: Secondary | ICD-10-CM

## 2018-11-13 NOTE — Progress Notes (Signed)
This visit type was conducted due to national recommendations for restrictions regarding the COVID-19 pandemic (e.g. social distancing).  This format is felt to be most appropriate for this patient at this time.  All issues noted in this document were discussed and addressed.  No physical exam was performed (except for noted visual exam findings with Video Visits). Virtual Visit via Video Note  I connected with@  on 11/16/18 at  3:00 PM EDT by a video enabled telemedicine application and verified that I am speaking with the correct person using two identifiers.  Location patient: home Location provider:work  Persons participating in the virtual visit: patient, provider  I discussed the limitations of evaluation and management by telemedicine and the availability of in person appointments. The patient expressed understanding and agreed to proceed.   HPI:  Doing well. No new complaints.  Following up for medication refill.   Depression- feels well. Taking celexa few days per week. Missing children at work since school has been closed. No si/hi.   No longer following with Dr Grayland Ormond for mammogram. Due.  Dr Terri Piedra  Left breast mass biopsy 2014, no atypia.   ROS: See pertinent positives and negatives per HPI.  Due pap smear, colonoscopy, CPE/labs.  Past Medical History:  Diagnosis Date  . Allergy    Seasonal  . Irritable bowel syndrome (IBS)   . Meniere's disease   . Migraines     Past Surgical History:  Procedure Laterality Date  . BREAST CYST ASPIRATION    . BREAST EXCISIONAL BIOPSY Left 2014   papilloma  . BREAST SURGERY    . CHOLECYSTECTOMY  1999  . COLONOSCOPY  2004   Dr Bary Castilla  . Sqamous Cancer Cell  2016   On chest    Family History  Problem Relation Age of Onset  . Cancer Mother 37       bladder  . Crohn's disease Mother   . Dementia Father   . Heart disease Father 54       Heart Attack  . Breast cancer Maternal Aunt 72  . Ovarian cancer Maternal Aunt  40    SOCIAL HX: never   Current Outpatient Medications:  .  cholecalciferol (VITAMIN D) 1000 UNITS tablet, Take 1,000 Units by mouth daily., Disp: , Rfl:  .  citalopram (CELEXA) 10 MG tablet, Take 1 tablet (10 mg total) by mouth daily., Disp: 90 tablet, Rfl: 3 .  levocetirizine (XYZAL) 5 MG tablet, Take 5 mg by mouth every evening., Disp: , Rfl:  .  Probiotic Product (PROBIOTIC DAILY PO), Take 1 tablet by mouth daily., Disp: , Rfl:  .  vitamin B-12 (CYANOCOBALAMIN) 100 MCG tablet, Take 100 mcg by mouth daily., Disp: , Rfl:   EXAM:  VITALS per patient if applicable:  GENERAL: alert, oriented, appears well and in no acute distress  HEENT: atraumatic, conjunttiva clear, no obvious abnormalities on inspection of external nose and ears  NECK: normal movements of the head and neck  LUNGS: on inspection no signs of respiratory distress, breathing rate appears normal, no obvious gross SOB, gasping or wheezing  CV: no obvious cyanosis  MS: moves all visible extremities without noticeable abnormality  PSYCH/NEURO: pleasant and cooperative, no obvious depression or anxiety, speech and thought processing grossly intact  ASSESSMENT AND PLAN:  Discussed the following assessment and plan:  Depression - Plan: citalopram (CELEXA) 10 MG tablet  Screening for breast cancer - Plan: MM 3D SCREEN BREAST BILATERAL  Problem List Items Addressed This Visit  Other   Depression - Primary    Stable.  Advised patient  to take Celexa 10 mg daily as prescribed as not sure effectiveness of taking every other day or couple days a week.  She verbalized understanding.  She declined changing medication or dose as feels well.  will continue to follow      Relevant Medications   citalopram (CELEXA) 10 MG tablet   Screening for breast cancer    Overdue.  Mammogram ordered.  Patient understands to schedule. She will return for CPE.       Relevant Orders   MM 3D SCREEN BREAST BILATERAL        I  discussed the assessment and treatment plan with the patient. The patient was provided an opportunity to ask questions and all were answered. The patient agreed with the plan and demonstrated an understanding of the instructions.   The patient was advised to call back or seek an in-person evaluation if the symptoms worsen or if the condition fails to improve as anticipated.   Mable Paris, FNP

## 2018-11-16 ENCOUNTER — Encounter: Payer: Self-pay | Admitting: Family

## 2018-11-16 DIAGNOSIS — Z1239 Encounter for other screening for malignant neoplasm of breast: Secondary | ICD-10-CM | POA: Insufficient documentation

## 2018-11-16 MED ORDER — CITALOPRAM HYDROBROMIDE 10 MG PO TABS: 10.0000 mg | ORAL_TABLET | Freq: Every day | ORAL | 3 refills | Status: DC

## 2018-11-16 NOTE — Assessment & Plan Note (Addendum)
Overdue.  Mammogram ordered.  Patient understands to schedule. She will return for CPE.

## 2018-11-16 NOTE — Assessment & Plan Note (Signed)
Stable.  Advised patient  to take Celexa 10 mg daily as prescribed as not sure effectiveness of taking every other day or couple days a week.  She verbalized understanding.  She declined changing medication or dose as feels well.  will continue to follow

## 2018-11-16 NOTE — Patient Instructions (Addendum)
Always a pleasure speaking with you.  Continue Celexa.  As discussed, I would advise you take this every day as not sure how effective it really is taking it every other day or couple times a week.  You may feel better being on it every day altogether.  Certainly let me know if like the medication needs to be changed , or dose etc.  Please ensure you call [facility] and schedule your mammogram since you are overdue.    Please also call our office and schedule a physical in the next few months.   Stay safe!

## 2018-11-26 ENCOUNTER — Encounter: Payer: Self-pay | Admitting: Family

## 2019-09-17 ENCOUNTER — Ambulatory Visit: Payer: Self-pay | Attending: Internal Medicine

## 2019-09-17 DIAGNOSIS — Z23 Encounter for immunization: Secondary | ICD-10-CM

## 2019-09-17 NOTE — Progress Notes (Signed)
   Covid-19 Vaccination Clinic  Name:  Jill Ward    MRN: [id number] DOB: 07-01-1956  09/17/2019  [name] was observed post Covid-19 immunization for 15 minutes without incident. She was provided with Vaccine Information Sheet and instruction to access the V-Safe system.   [name] was instructed to call 911 with any severe reactions post vaccine: Marland Kitchen Difficulty breathing  . Swelling of face and throat  . A fast heartbeat  . A bad rash all over body  . Dizziness and weakness   Immunizations Administered    Name Date Dose VIS Date Route   Pfizer COVID-19 Vaccine 09/17/2019  8:54 AM 0.3 mL 06/18/2019 Intramuscular   Manufacturer: [place]   Lot: [id number]   [place]: [id number]

## 2019-10-12 ENCOUNTER — Ambulatory Visit: Payer: Self-pay

## 2019-10-19 ENCOUNTER — Ambulatory Visit: Payer: Self-pay | Attending: Internal Medicine

## 2019-10-19 DIAGNOSIS — Z23 Encounter for immunization: Secondary | ICD-10-CM

## 2019-10-19 NOTE — Progress Notes (Signed)
   Covid-19 Vaccination Clinic  Name:  Jill Ward    MRN: [id number] DOB: [birth year]  10/19/2019  Jill Ward was observed post Covid-19 immunization for 15 minutes without incident. She was provided with Vaccine Information Sheet and instruction to access the V-Safe system.   Jill Ward was instructed to call 911 with any severe reactions post vaccine: Marland Kitchen Difficulty breathing  . Swelling of face and throat  . A fast heartbeat  . A bad rash all over body  . Dizziness and weakness   Immunizations Administered    Name Date Dose VIS Date Route   Pfizer COVID-19 Vaccine 10/19/2019  8:27 AM 0.3 mL 06/18/2019 Intramuscular   Manufacturer: [place]   Lot: [id number]   [place]: [id number]

## 2019-12-01 ENCOUNTER — Other Ambulatory Visit: Payer: Self-pay | Admitting: Family

## 2019-12-01 DIAGNOSIS — F32A Depression, unspecified: Secondary | ICD-10-CM

## 2020-10-04 ENCOUNTER — Other Ambulatory Visit: Payer: Self-pay | Admitting: Family

## 2020-10-04 DIAGNOSIS — F32A Depression, unspecified: Secondary | ICD-10-CM

## 2021-01-22 ENCOUNTER — Ambulatory Visit
Admission: EM | Admit: 2021-01-22 | Discharge: 2021-01-22 | Disposition: A | Discharge status: Home / Self Care | Payer: Medicare Other | Attending: Emergency Medicine | Admitting: Emergency Medicine

## 2021-01-22 ENCOUNTER — Other Ambulatory Visit: Payer: Self-pay

## 2021-01-22 ENCOUNTER — Encounter: Payer: Self-pay | Admitting: Emergency Medicine

## 2021-01-22 DIAGNOSIS — L089 Local infection of the skin and subcutaneous tissue, unspecified: Secondary | ICD-10-CM | POA: Diagnosis not present

## 2021-01-22 DIAGNOSIS — S80811A Abrasion, right lower leg, initial encounter: Secondary | ICD-10-CM

## 2021-01-22 MED ORDER — DOXYCYCLINE HYCLATE 100 MG PO CAPS: 100.0000 mg | ORAL_CAPSULE | Freq: Two times a day (BID) | ORAL | 0 refills | Status: DC

## 2021-01-22 MED ORDER — MUPIROCIN 2 % EX OINT: 1.0000 "application " | TOPICAL_OINTMENT | Freq: Two times a day (BID) | CUTANEOUS | 0 refills | Status: AC

## 2021-01-22 MED ORDER — MUPIROCIN CALCIUM 2 % EX CREA: 1.0000 "application " | TOPICAL_CREAM | Freq: Two times a day (BID) | CUTANEOUS | 0 refills | Status: DC

## 2021-01-22 NOTE — Discharge Instructions (Addendum)
Take medication as directed.  How to dress your wound: Apply topical Bactroban ointment Then apply a telfa nonstick pad Then apply gauze Cover with tape or ACE wrap  Return for any worsening of symptoms.

## 2021-01-22 NOTE — ED Triage Notes (Signed)
Pt presents today with c/o of abrasion to RLE x 3 weeks. She reports scraping it on a wicker basket. She has been applying topical Neosporin to area. +redness

## 2021-01-22 NOTE — ED Provider Notes (Signed)
Chief Complaint   Chief Complaint  Patient presents with   Abrasion    RLE     Subjective, HPI  Jill Ward is a very pleasant 65 y.o. female who presents with abrasion to right lower extremity onset approximately 3 weeks ago.  Patient states that she scraped the area on a wicker basket.  Patient has been applying topical Neosporin to the area, but reports increased redness, draining and discomfort to the area.  Patient states that she has tried to keep the area open to air, but states that that has been difficult given the drainage.  Patient does not report any additional injuries, fever or chills today she also does not report any vomiting.  History obtained from patient.   Patient's problem list, past medical and social history, medications, and allergies were reviewed by me and updated in Epic.    ROS  See HPI.  Objective   Vitals:   01/22/21 1537  BP: 127/78  Pulse: 81  Resp: 18  Temp: 98.3 F (36.8 C)  SpO2: 96%    Vital signs and nursing note reviewed.  General: Appears well-developed and well-nourished. No acute distress.  HEENT: Normocephalic, atraumatic, hearing grossly intact. EOMI, no drainage. No rhinorrhea. Moist mucous membranes.  Neck: Normal range of motion, neck is supple.  Cardiovascular: Normal rate.  Pulm/Chest: No respiratory distress.   Musculoskeletal: No joint deformity, normal range of motion.  Skin: Erythematous yellow crusted abrasion noted to right lower extremity to the lateral aspect of the right calf measuring approximately 8 cm in length.  Area has surrounding erythema.  Data  No results found for any visits on 01/22/21.    Assessment & Plan  1. Infected abrasion of right leg, initial encounter  Meds ordered this encounter  Medications   doxycycline (VIBRAMYCIN) 100 MG capsule    Sig: Take 1 capsule (100 mg total) by mouth 2 (two) times daily.    Dispense:  20 capsule    Refill:  0    Order Specific Question:   Supervising  Provider    Answer:   Chase Picket [[id number]]   DISCONTD: mupirocin cream (BACTROBAN) 2 %    Sig: Apply 1 application topically 2 (two) times daily.    Dispense:  15 g    Refill:  0    Order Specific Question:   Supervising Provider    Answer:   Chase Picket [[id number]]   mupirocin ointment (BACTROBAN) 2 %    Sig: Apply 1 application topically 2 (two) times daily for 10 days.    Dispense:  22 g    Refill:  0    Order Specific Question:   Supervising Provider    Answer:   Chase Picket [[id number]]     65 y.o. female presents with abrasion to right lower extremity onset approximately 3 weeks ago.  Patient states that she scraped the area on a wicker basket.  Patient has been applying topical Neosporin to the area, but reports increased redness, draining and discomfort to the area.  Patient states that she has tried to keep the area open to air, but states that that has been difficult given the drainage.  Patient does not report any additional injuries, fever or chills today she also does not report any vomiting.  Chart review completed.  Given symptoms along with assessment findings, likely abrasion with infection.  Rx'd mupirocin and doxycycline to the patient's preferred pharmacy and advised about home treatment and care to include how  to specifically do her dressing changes.  Return as needed for any worsening of symptoms.  Patient verbalized understanding and agreed with plan.  Stable on discharge.  Plan:   Discharge Instructions      Take medication as directed.  How to dress your wound: Apply topical Bactroban ointment Then apply a telfa nonstick pad Then apply gauze Cover with tape or ACE wrap  Return for any worsening of symptoms.       Serafina Royals, FNP-C 01/22/21  This note was partially made with the aid of speech-to-text dictation; typographical errors are not intentional.    Serafina Royals, [place] 01/22/21 1558

## 2021-01-26 ENCOUNTER — Encounter: Payer: Self-pay | Admitting: Emergency Medicine

## 2021-01-26 ENCOUNTER — Other Ambulatory Visit: Payer: Self-pay

## 2021-01-26 ENCOUNTER — Ambulatory Visit
Admission: EM | Admit: 2021-01-26 | Discharge: 2021-01-26 | Disposition: A | Discharge status: Home / Self Care | Payer: Medicare Other | Attending: Emergency Medicine | Admitting: Emergency Medicine

## 2021-01-26 DIAGNOSIS — B880 Other acariasis: Secondary | ICD-10-CM

## 2021-01-26 MED ORDER — TRIAMCINOLONE ACETONIDE 0.1 % EX CREA: 1.0000 "application " | TOPICAL_CREAM | Freq: Two times a day (BID) | CUTANEOUS | 0 refills | Status: DC

## 2021-01-26 MED ORDER — PREDNISONE 10 MG PO TABS: ORAL_TABLET | ORAL | 0 refills | Status: AC

## 2021-01-26 NOTE — ED Provider Notes (Signed)
Chief Complaint   Chief Complaint  Patient presents with   Rash     Subjective, HPI  Jill Ward is a very pleasant 65 y.o. female who presents with rash to neck and right lower extremity.  Patient states that she was seen on 7/18 for an abrasion to the right lower extremity.  Patient states that her rash has been there for quite some time.  Patient states that she did spend some time with a friend of hers who had a very lush garden, but does not report any known contact with venomous plants.  Patient states that it seems as if the area is spreading.  Patient reports that she initially thought it was mosquito bites due to itchiness.  Patient reports that she has used antibacterial soap and hydrocortisone which has helped somewhat, but given that the area continues to spread and is itchy she wanted the area to be checked out.  Patient does not report any use of over-the-counter antihistamines.  She does not report any fever, chills, vomiting or visual changes today.  History obtained from patient.   Patient's problem list, past medical and social history, medications, and allergies were reviewed by me and updated in Epic.    ROS  See HPI.  Objective   Vitals:   01/26/21 1105  BP: 99/70  Pulse: 79  Resp: 18  Temp: (!) 97.5 F (36.4 C)  SpO2: 98%    Vital signs and nursing note reviewed.  General: Appears well-developed and well-nourished. No acute distress.  HEENT: Normocephalic, atraumatic, hearing grossly intact. EOMI, no drainage. No rhinorrhea. Moist mucous membranes.  Neck: Normal range of motion, neck is supple.  Cardiovascular: Normal rate.  Pulm/Chest: No respiratory distress.   Musculoskeletal: No joint deformity, normal range of motion.  Skin: Erythematous insect bites noted to posterior neck region, right lower extremity, right ear.  Some areas are excoriated, no areas of drainage.  Data  No results found for any visits on 01/26/21.    Assessment & Plan  1.  Chigger bites  Meds ordered this encounter  Medications   predniSONE (DELTASONE) 10 MG tablet    Sig: Take 6 tablets (60 mg total) by mouth daily for 1 day, THEN 5 tablets (50 mg total) daily for 1 day, THEN 4 tablets (40 mg total) daily for 1 day, THEN 3 tablets (30 mg total) daily for 1 day, THEN 2 tablets (20 mg total) daily for 1 day, THEN 1 tablet (10 mg total) daily for 1 day.    Dispense:  21 tablet    Refill:  0    Order Specific Question:   Supervising Provider    Answer:   Chase Picket [id number]   triamcinolone cream (KENALOG) 0.1 %    Sig: Apply 1 application topically 2 (two) times daily.    Dispense:  30 g    Refill:  0    Order Specific Question:   Supervising Provider    Answer:   Chase Picket [id number]     65 y.o. female presents with rash to neck and right lower extremity.  Patient states that she was seen on 7/18 for an abrasion to the right lower extremity.  Patient states that her rash has been there for quite some time.  Patient states that she did spend some time with a friend of hers who had a very lush garden, but does not report any known contact with venomous plants.  Patient states that it seems as if  the area is spreading.  Patient reports that she initially thought it was mosquito bites due to itchiness.  Patient reports that she has used antibacterial soap and hydrocortisone which has helped somewhat, but given that the area continues to spread and is itchy she wanted the area to be checked out.  Patient does not report any use of over-the-counter antihistamines.  She does not report any fever, chills, vomiting or visual changes today.  Chart review completed.  Given symptoms along with assessment findings, likely chigger bites.  Area is widespread over the patient's posterior neck region, will Rx a prednisone taper and triamcinolone cream to the patient's preferred pharmacy.  Advised about home treatment and care to include antihistamines.  Return as needed.   Advised strict return precautions with PCP for continued symptoms.  Patient verbalized understanding and agreed with plan.  Stable on discharge.  Plan:   Discharge Instructions      You are on a steroid. This may help you condition and/pain but there are risks. Serious risks include an increased chance in having sepsis, a pulmonary embolism, or a bone fracture for the next 30 days. Common side effects include increased appetite, insomnia, blood sugar rise, irritability.    Take/use all medications as prescribed. Increase fluid intake. You may take Benadryl 25-'50mg'$  every 4-6 hours as needed OR Zyrtec '10mg'$  daily for itching/inflammation. You may also take over the counter Famotidine '20mg'$  once daily to help with itching/inflammation. You may apply ice wrapped in a towel to affected area 3-5 times daily for 10-15 minute intervals. See your PCP if rash does not improve in 5-6 days. See your PCP or return to clinic sooner if rash worsens or you develop fever.Erasmo Downer Quadry Kampa, FNP-C 01/26/21  This note was partially made with the aid of speech-to-text dictation; typographical errors are not intentional.    Serafina Royals, [facility] 01/26/21 1123

## 2021-01-26 NOTE — Discharge Instructions (Addendum)
You are on a steroid. This may help you condition and/pain but there are risks. Serious risks include an increased chance in having sepsis, a pulmonary embolism, or a bone fracture for the next 30 days. Common side effects include increased appetite, insomnia, blood sugar rise, irritability.    Take/use all medications as prescribed. Increase fluid intake. You may take Benadryl 25-'50mg'$  every 4-6 hours as needed OR Zyrtec '10mg'$  daily for itching/inflammation. You may also take over the counter Famotidine '20mg'$  once daily to help with itching/inflammation. You may apply ice wrapped in a towel to affected area 3-5 times daily for 10-15 minute intervals. See your PCP if rash does not improve in 5-6 days. See your PCP or return to clinic sooner if rash worsens or you develop fever.Marland Kitchen

## 2021-01-26 NOTE — ED Triage Notes (Signed)
Pt presents today with rash to back of neck and RLE. She was seen here on 01/22/21 for abrasion to right lower leg from wicker basket. She reports rash to neck was there before 01/22/21.

## 2021-02-09 ENCOUNTER — Other Ambulatory Visit: Payer: Self-pay

## 2021-02-09 ENCOUNTER — Encounter: Payer: Self-pay | Admitting: Family

## 2021-02-09 ENCOUNTER — Ambulatory Visit (INDEPENDENT_AMBULATORY_CARE_PROVIDER_SITE_OTHER): Payer: Medicare Other | Admitting: Family

## 2021-02-09 ENCOUNTER — Other Ambulatory Visit (HOSPITAL_COMMUNITY)
Admission: RE | Admit: 2021-02-09 | Discharge: 2021-02-09 | Disposition: A | Discharge status: Home / Self Care | Payer: Medicare Other | Source: Ambulatory Visit | Attending: Family | Admitting: Family

## 2021-02-09 VITALS — BP 100/68 | HR 78 | Temp 98.0°F | Ht 66.0 in | Wt 158.0 lb

## 2021-02-09 DIAGNOSIS — Z23 Encounter for immunization: Secondary | ICD-10-CM

## 2021-02-09 DIAGNOSIS — Z0000 Encounter for general adult medical examination without abnormal findings: Secondary | ICD-10-CM

## 2021-02-09 DIAGNOSIS — F32A Depression, unspecified: Secondary | ICD-10-CM

## 2021-02-09 LAB — LIPID PANEL
Cholesterol: 239 mg/dL — ABNORMAL HIGH (ref 0–200)
HDL: 72.3 mg/dL (ref >39.00)
LDL Cholesterol: 144 mg/dL — ABNORMAL HIGH (ref 0–99)
NonHDL: 166.37
Total CHOL/HDL Ratio: 3
Triglycerides: 112 mg/dL (ref 0.0–149.0)
VLDL: 22.4 mg/dL (ref 0.0–40.0)

## 2021-02-09 LAB — URINALYSIS, ROUTINE W REFLEX MICROSCOPIC
Bilirubin Urine: NEGATIVE
Hgb urine dipstick: NEGATIVE
Ketones, ur: NEGATIVE
Leukocytes,Ua: NEGATIVE
Nitrite: NEGATIVE
RBC / HPF: NONE SEEN (ref 0–?)
Specific Gravity, Urine: 1.01 (ref 1.000–1.030)
Total Protein, Urine: NEGATIVE
Urine Glucose: NEGATIVE
Urobilinogen, UA: 0.2 (ref 0.0–1.0)
pH: 5.5 (ref 5.0–8.0)

## 2021-02-09 LAB — COMPREHENSIVE METABOLIC PANEL
ALT: 10 U/L (ref 0–35)
AST: 13 U/L (ref 0–37)
Albumin: 4 g/dL (ref 3.5–5.2)
Alkaline Phosphatase: 69 U/L (ref 39–117)
BUN: 15 mg/dL (ref 6–23)
CO2: 27 mEq/L (ref 19–32)
Calcium: 9.3 mg/dL (ref 8.4–10.5)
Chloride: 101 mEq/L (ref 96–112)
Creatinine, Ser: 0.71 mg/dL (ref 0.40–1.20)
GFR: 89.32 mL/min (ref >60.00)
Glucose, Bld: 86 mg/dL (ref 70–99)
Potassium: 4.3 mEq/L (ref 3.5–5.1)
Sodium: 135 mEq/L (ref 135–145)
Total Bilirubin: 0.5 mg/dL (ref 0.2–1.2)
Total Protein: 6.8 g/dL (ref 6.0–8.3)

## 2021-02-09 LAB — CBC WITH DIFFERENTIAL/PLATELET
Basophils Absolute: 0.1 10*3/uL (ref 0.0–0.1)
Basophils Relative: 1.1 % (ref 0.0–3.0)
Eosinophils Absolute: 0.1 10*3/uL (ref 0.0–0.7)
Eosinophils Relative: 1.8 % (ref 0.0–5.0)
HCT: 39.5 % (ref 36.0–46.0)
Hemoglobin: 12.9 g/dL (ref 12.0–15.0)
Lymphocytes Relative: 28.8 % (ref 12.0–46.0)
Lymphs Abs: 2.1 10*3/uL (ref 0.7–4.0)
MCHC: 32.7 g/dL (ref 30.0–36.0)
MCV: 92.8 fl (ref 78.0–100.0)
Monocytes Absolute: 0.6 10*3/uL (ref 0.1–1.0)
Monocytes Relative: 7.6 % (ref 3.0–12.0)
Neutro Abs: 4.5 10*3/uL (ref 1.4–7.7)
Neutrophils Relative %: 60.7 % (ref 43.0–77.0)
Platelets: 293 10*3/uL (ref 150.0–400.0)
RBC: 4.25 Mil/uL (ref 3.87–5.11)
RDW: 13.8 % (ref 11.5–15.5)
WBC: 7.4 10*3/uL (ref 4.0–10.5)

## 2021-02-09 LAB — VITAMIN D 25 HYDROXY (VIT D DEFICIENCY, FRACTURES): VITD: 30.19 ng/mL (ref 30.00–100.00)

## 2021-02-09 LAB — TSH: TSH: 1.84 u[IU]/mL (ref 0.35–5.50)

## 2021-02-09 LAB — HEMOGLOBIN A1C: Hgb A1c MFr Bld: 5.6 % (ref 4.6–6.5)

## 2021-02-09 NOTE — Progress Notes (Addendum)
Subjective:    Patient ID: Jill Ward, female    DOB: 11/19/1955, 65 y.o.   MRN: [id number]  CC: Jill Ward is a 65 y.o. female who presents today for physical exam.    HPI: Feels well today.  No complaints. She is frustrated by weight gain since she has come off [employer]. Endorses dietary indiscretion and feels confident she can loose weight again.   She been compliant with Celexa 10 mg although she is sure she needs this medication. She may wean off medication. No si/hi.   Colorectal Cancer Screening: due; she has never had Breast Cancer Screening: Mammogram due Cervical Cancer Screening: due Bone Health screening/DEXA for 65+: due  Lung Cancer Screening: Doesn't have 20 year pack year history and age > 66 years yo 31 years       Tetanus - utd        Pneumococcal - Candidate for, consents Hepatitis C screening - Candidate for, declines HIV Screening- Candidate for, declines Labs: Screening labs today. Exercise: No regular exercise.   Alcohol use:  none Smoking/tobacco use: Nonsmoker.     HISTORY:  Past Medical History:  Diagnosis Date   Allergy    Seasonal   Irritable bowel syndrome (IBS)    Meniere's disease    Migraines     Past Surgical History:  Procedure Laterality Date   BREAST CYST ASPIRATION     BREAST EXCISIONAL BIOPSY Left 2014   papilloma   BREAST SURGERY     CHOLECYSTECTOMY  1999   COLONOSCOPY  2004   Dr Bary Castilla   Sqamous Cancer Cell  2016   On chest   Family History  Problem Relation Age of Onset   Cancer Mother 48       bladder   Crohn's disease Mother    Dementia Father    Heart disease Father 63       Heart Attack   Ovarian cancer Paternal Grandmother 41   Breast cancer Maternal Aunt 40   Ovarian cancer Maternal Aunt 52      ALLERGIES: Codeine  Current Outpatient Medications on File Prior to Visit  Medication Sig Dispense Refill   citalopram (CELEXA) 10 MG tablet TAKE ONE TABLET EVERY DAY 90 tablet 3   cholecalciferol  (VITAMIN D) 1000 UNITS tablet Take 1,000 Units by mouth daily. (Patient not taking: Reported on 02/09/2021)     No current facility-administered medications on file prior to visit.    Social History   Tobacco Use   Smoking status: Never   Smokeless tobacco: Never  Substance Use Topics   Alcohol use: No    Alcohol/week: 0.0 standard drinks   Drug use: No    Review of Systems  Constitutional:  Negative for chills, fever and unexpected weight change.  HENT:  Negative for congestion.   Respiratory:  Negative for cough.   Cardiovascular:  Negative for chest pain, palpitations and leg swelling.  Gastrointestinal:  Negative for nausea and vomiting.  Musculoskeletal:  Negative for arthralgias and myalgias.  Skin:  Negative for rash.  Neurological:  Negative for headaches.  Hematological:  Negative for adenopathy.  Psychiatric/Behavioral:  Negative for confusion, sleep disturbance and suicidal ideas. The patient is not nervous/anxious.      Objective:    BP 100/68 (BP Location: Left Arm, Patient Position: Sitting, Cuff Size: Large)   Pulse 78   Temp 98 F (36.7 C) (Oral)   Ht '5\' 6"'$  (1.676 m)   Wt 158 lb (71.7 kg)  SpO2 96%   BMI 25.50 kg/m   BP Readings from Last 3 Encounters:  02/09/21 100/68  01/26/21 99/70  01/22/21 127/78   Wt Readings from Last 3 Encounters:  02/09/21 158 lb (71.7 kg)  05/14/17 149 lb (67.6 kg)  11/26/16 154 lb (69.9 kg)    Physical Exam Vitals reviewed.  Constitutional:      Appearance: Normal appearance. She is well-developed.  Eyes:     Conjunctiva/sclera: Conjunctivae normal.  Neck:     Thyroid: No thyroid mass or thyromegaly.  Cardiovascular:     Rate and Rhythm: Normal rate and regular rhythm.     Pulses: Normal pulses.     Heart sounds: Normal heart sounds.  Pulmonary:     Effort: Pulmonary effort is normal.     Breath sounds: Normal breath sounds. No wheezing, rhonchi or rales.  Chest:  Breasts:    Breasts are symmetrical.      Right: No inverted nipple, mass, nipple discharge, skin change or tenderness.     Left: No inverted nipple, mass, nipple discharge, skin change or tenderness.  Abdominal:     General: Bowel sounds are normal. There is no distension.     Palpations: Abdomen is soft. Abdomen is not rigid. There is no fluid wave or mass.     Tenderness: There is no abdominal tenderness. There is no guarding or rebound.  Genitourinary:    Cervix: No cervical motion tenderness, discharge or friability.     Uterus: Not enlarged, not fixed and not tender.      Adnexa:        Right: No mass, tenderness or fullness.         Left: No mass, tenderness or fullness.       Comments: Pap performed. No CMT. Unable to appreciated ovaries. Lymphadenopathy:     Head:     Right side of head: No submental, submandibular, tonsillar, preauricular, posterior auricular or occipital adenopathy.     Left side of head: No submental, submandibular, tonsillar, preauricular, posterior auricular or occipital adenopathy.     Cervical:     Right cervical: No superficial, deep or posterior cervical adenopathy.    Left cervical: No superficial, deep or posterior cervical adenopathy.     Upper Body:     Right upper body: No pectoral adenopathy.     Left upper body: No pectoral adenopathy.  Skin:    General: Skin is warm and dry.  Neurological:     Mental Status: She is alert.  Psychiatric:        Speech: Speech normal.        Behavior: Behavior normal.        Thought Content: Thought content normal.       Assessment & Plan:   Problem List Items Addressed This Visit       Other   Annual physical exam    Clinical breast exam and Pap smear performed today.  Prevnar 20 given.  Mammogram and pregnancy scheduled for patient. She declines colonoscopy at this time.  Addendum: we also discussed importance of follow-up dermatology.  Patient plans to make an appoint with her dermatologist from [facility].  She declines any referral for  me at this time.       Depression    Chronic stable. Discussed she may wean off celexa '10mg'$ by taking every other day for one then and every 3rd day for one week if she desires. Also advised she may continue low dose medication as may  be offering benefit.        Other Visit Diagnoses     Encounter for annual physical exam    -  Primary   Relevant Orders   VITAMIN D 25 Hydroxy (Vit-D Deficiency, Fractures)   Hemoglobin A1c   TSH   CBC with Differential/Platelet   Comprehensive metabolic panel   Lipid panel   Cytology - PAP( [place])   DG Bone Density   Urinalysis, Routine w reflex microscopic   MM 3D SCREEN BREAST BILATERAL   Need for pneumococcal vaccination       Relevant Orders   Pneumococcal conjugate vaccine 20-valent (Prevnar 20) (Completed)        I have discontinued Magda Paganini B. [place]'s Probiotic Product (PROBIOTIC DAILY PO), vitamin B-12, levocetirizine, doxycycline, and triamcinolone cream. I am also having her maintain her cholecalciferol and citalopram.   No orders of the defined types were placed in this encounter.   Return precautions given.   Risks, benefits, and alternatives of the medications and treatment plan prescribed today were discussed, and patient expressed understanding.   Education regarding symptom management and diagnosis given to patient on AVS.   Continue to follow with Burnard Hawthorne, FNP for routine health maintenance.   Vickii Chafe and I agreed with plan.   Mable Paris, FNP

## 2021-02-09 NOTE — Patient Instructions (Signed)
Always so nice to see you  Mammogram and bone density as we scheduled  Health Maintenance for Postmenopausal Women Menopause is a normal process in which your ability to get pregnant comes to an end. This process happens slowly over many months or years, usually between the ages of 57 and 51. Menopause is complete when you have missed your menstrualperiods for 12 months. It is important to talk with your health care provider about some of the most common conditions that affect women after menopause (postmenopausal women). These include heart disease, cancer, and bone loss (osteoporosis). Adopting a healthy lifestyle and getting preventive care can help to promote your health and wellness. The actions you take can also lower your chances ofdeveloping some of these common conditions. What should I know about menopause? During menopause, you may get a number of symptoms, such as: Hot flashes. These can be moderate or severe. Night sweats. Decrease in sex drive. Mood swings. Headaches. Tiredness. Irritability. Memory problems. Insomnia. Choosing to treat or not to treat these symptoms is a decision that you makewith your health care provider. Do I need hormone replacement therapy? Hormone replacement therapy is effective in treating symptoms that are caused by menopause, such as hot flashes and night sweats. Hormone replacement carries certain risks, especially as you become older. If you are thinking about using estrogen or estrogen with progestin, discuss the benefits and risks with your health care provider. What is my risk for heart disease and stroke? The risk of heart disease, heart attack, and stroke increases as you age. One of the causes may be a change in the body's hormones during menopause. This can affect how your body uses dietary fats, triglycerides, and cholesterol. Heart attack and stroke are medical emergencies. There are many things that you cando to help prevent heart disease  and stroke. Watch your blood pressure High blood pressure causes heart disease and increases the risk of stroke. This is more likely to develop in people who have high blood pressure readings, are of African descent, or are overweight. Have your blood pressure checked: Every 3-5 years if you are 46-35 years of age. Every year if you are 64 years old or older. Eat a healthy diet  Eat a diet that includes plenty of vegetables, fruits, low-fat dairy products, and lean protein. Do not eat a lot of foods that are high in solid fats, added sugars, or sodium.  Get regular exercise Get regular exercise. This is one of the most important things you can do for your health. Most adults should: Try to exercise for at least 150 minutes each week. The exercise should increase your heart rate and make you sweat (moderate-intensity exercise). Try to do strengthening exercises at least twice each week. Do these in addition to the moderate-intensity exercise. Spend less time sitting. Even light physical activity can be beneficial. Other tips Work with your health care provider to achieve or maintain a healthy weight. Do not use any products that contain nicotine or tobacco, such as cigarettes, e-cigarettes, and chewing tobacco. If you need help quitting, ask your health care provider. Know your numbers. Ask your health care provider to check your cholesterol and your blood sugar (glucose). Continue to have your blood tested as directed by your health care provider. Do I need screening for cancer? Depending on your health history and family history, you may need to have cancer screening at different stages of your life. This may include screening for: Breast cancer. Cervical cancer. Lung cancer. Colorectal  cancer. What is my risk for osteoporosis? After menopause, you may be at increased risk for osteoporosis. Osteoporosis is a condition in which bone destruction happens more quickly than new bone creation.  To help prevent osteoporosis or the bone fractures that can happen because of osteoporosis, you may take the following actions: If you are 39-51 years old, get at least 1,000 mg of calcium and at least 600 mg of vitamin D per day. If you are older than age 70 but younger than age 36, get at least 1,200 mg of calcium and at least 600 mg of vitamin D per day. If you are older than age 58, get at least 1,200 mg of calcium and at least 800 mg of vitamin D per day. Smoking and drinking excessive alcohol increase the risk of osteoporosis. Eat foods that are rich in calcium and vitamin D, and do weight-bearing exercisesseveral times each week as directed by your health care provider. How does menopause affect my mental health? Depression may occur at any age, but it is more common as you become older. Common symptoms of depression include: Low or sad mood. Changes in sleep patterns. Changes in appetite or eating patterns. Feeling an overall lack of motivation or enjoyment of activities that you previously enjoyed. Frequent crying spells. Talk with your health care provider if you think that you are experiencingdepression. General instructions See your health care provider for regular wellness exams and vaccines. This may include: Scheduling regular health, dental, and eye exams. Getting and maintaining your vaccines. These include: Influenza vaccine. Get this vaccine each year before the flu season begins. Pneumonia vaccine. Shingles vaccine. Tetanus, diphtheria, and pertussis (Tdap) booster vaccine. Your health care provider may also recommend other immunizations. Tell your health care provider if you have ever been abused or do not feel safeat home. Summary Menopause is a normal process in which your ability to get pregnant comes to an end. This condition causes hot flashes, night sweats, decreased interest in sex, mood swings, headaches, or lack of sleep. Treatment for this condition may  include hormone replacement therapy. Take actions to keep yourself healthy, including exercising regularly, eating a healthy diet, watching your weight, and checking your blood pressure and blood sugar levels. Get screened for cancer and depression. Make sure that you are up to date with all your vaccines. This information is not intended to replace advice given to you by your health care provider. Make sure you discuss any questions you have with your healthcare provider. Document Revised: 06/17/2018 Document Reviewed: 06/17/2018 Elsevier Patient Education  2022 [employer].

## 2021-02-09 NOTE — Assessment & Plan Note (Signed)
Chronic stable. Discussed she may wean off celexa '10mg'$ by taking every other day for one then and every 3rd day for one week if she desires. Also advised she may continue low dose medication as may be offering benefit.

## 2021-02-09 NOTE — Assessment & Plan Note (Addendum)
Clinical breast exam and Pap smear performed today.  Prevnar 20 given.  Mammogram and pregnancy scheduled for patient. She declines colonoscopy at this time.  Addendum: we also discussed importance of follow-up dermatology.  Patient plans to make an appoint with her dermatologist from [facility].  She declines any referral for me at this time.

## 2021-02-13 LAB — CYTOLOGY - PAP
Adequacy: ABSENT
Diagnosis: NEGATIVE

## 2021-02-19 ENCOUNTER — Encounter: Payer: Self-pay | Admitting: Family

## 2021-02-23 ENCOUNTER — Telehealth: Payer: Self-pay

## 2021-02-23 NOTE — Telephone Encounter (Signed)
LMTCB in regards to lab results.  

## 2021-02-26 ENCOUNTER — Telehealth: Payer: Self-pay

## 2021-02-26 NOTE — Telephone Encounter (Signed)
Pt wants to know if she needs a refill on her antibiotic or a steroid. She was seen for a tear on her right leg and she states that there is now red bumps. By the end of the call-pt states that she was going to go back to the UC across the street. FYI for Mable Paris, FNP

## 2021-02-27 NOTE — Telephone Encounter (Signed)
Placed call to pt to see if she went to UC. LMTCB

## 2021-02-28 NOTE — Telephone Encounter (Signed)
LMTCB

## 2021-03-02 NOTE — Telephone Encounter (Signed)
Placed call to pt to see if she had been seen for the issues in the message below. LMTCB

## 2021-03-06 NOTE — Telephone Encounter (Signed)
LMTCB to see if issue was resolved

## 2021-03-19 ENCOUNTER — Ambulatory Visit
Admission: RE | Admit: 2021-03-19 | Discharge: 2021-03-19 | Disposition: A | Discharge status: Home / Self Care | Payer: Medicare Other | Source: Ambulatory Visit | Attending: Family | Admitting: Family

## 2021-03-19 ENCOUNTER — Other Ambulatory Visit: Payer: Self-pay

## 2021-03-19 DIAGNOSIS — Z13820 Encounter for screening for osteoporosis: Secondary | ICD-10-CM | POA: Insufficient documentation

## 2021-03-19 DIAGNOSIS — Z1231 Encounter for screening mammogram for malignant neoplasm of breast: Secondary | ICD-10-CM | POA: Insufficient documentation

## 2021-03-19 DIAGNOSIS — Z0000 Encounter for general adult medical examination without abnormal findings: Secondary | ICD-10-CM

## 2021-03-19 DIAGNOSIS — Z780 Asymptomatic menopausal state: Secondary | ICD-10-CM | POA: Insufficient documentation

## 2021-03-30 ENCOUNTER — Telehealth: Payer: Self-pay | Admitting: Family

## 2021-03-30 DIAGNOSIS — L989 Disorder of the skin and subcutaneous tissue, unspecified: Secondary | ICD-10-CM

## 2021-03-30 NOTE — Telephone Encounter (Signed)
Patient is requesting a referral to dermatology

## 2021-04-02 NOTE — Telephone Encounter (Signed)
LM to call so I can get specific reason for referral.

## 2021-04-03 NOTE — Telephone Encounter (Signed)
Are you willing to place for patient? She has some spots that she does feel are cancerous just would like looked at & removed as well as have a full body check. She was last seen in 2016 & needs new referral. She would like to see her prior dermatologist:  DR. Warren Lacy DEVORE [email or web address] [address] [place], [place] [address]  [phone]

## 2021-04-04 NOTE — Telephone Encounter (Signed)
Patient returned call . She was informed that a referral was placed.

## 2021-04-04 NOTE — Telephone Encounter (Signed)
Ref to dr Mosetta Pigeon placed Let pt know Ask patient to call and schedule ASAP appointment especially if she has concerns for cancerous lesion

## 2021-04-04 NOTE — Telephone Encounter (Signed)
LM that referral was placed. If she does not hear please let us know.

## 2021-04-04 NOTE — Addendum Note (Signed)
Addended by: Burnard Hawthorne on: 04/04/2021 02:08 PM   Modules accepted: Orders

## 2021-06-13 ENCOUNTER — Telehealth: Payer: Self-pay

## 2021-06-13 ENCOUNTER — Encounter: Payer: Self-pay | Admitting: Family

## 2021-06-13 ENCOUNTER — Telehealth (INDEPENDENT_AMBULATORY_CARE_PROVIDER_SITE_OTHER): Payer: Medicare Other | Admitting: Family

## 2021-06-13 DIAGNOSIS — M5442 Lumbago with sciatica, left side: Secondary | ICD-10-CM | POA: Diagnosis not present

## 2021-06-13 DIAGNOSIS — M545 Low back pain, unspecified: Secondary | ICD-10-CM | POA: Insufficient documentation

## 2021-06-13 DIAGNOSIS — F32A Depression, unspecified: Secondary | ICD-10-CM | POA: Diagnosis not present

## 2021-06-13 MED ORDER — CITALOPRAM HYDROBROMIDE 20 MG PO TABS: 20.0000 mg | ORAL_TABLET | Freq: Every day | ORAL | 3 refills | Status: DC

## 2021-06-13 NOTE — Assessment & Plan Note (Signed)
Unable to assess patient in person today.  Description of pain raises suspicion for spinal stenosis, degenerative disc disease, trochanteric bursitis.  Pain is improving and patient declines any imaging at this time.  Advised that she may use Tylenol arthritis scheduled doses.  If pain were to persist advised  to schedule in person exam, imaging.

## 2021-06-13 NOTE — Progress Notes (Signed)
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  Compliant with the Celexa 10 mg Tried prozac in the past with side effects.  Feels more irritable.  Sleeping well.  She had unexpected move last year and feels sad about making this change. She misses her old home.   Complains of  right low back pain x 10 days,  improved. Pain in right hip. Pain when sleeping on right hip. She has been using ibuprofen , heat with improvement. She describes pain in right buttocks to right lateral knee. She had recent URI in which she felt cough affected low back and started symptoms. No other injury.  No numbness in legs, groin, trouble urinating or having bowel movement.  She would like to discuss mobic  Due colonoscopy and declines at this time.  Up-to-date mammogram  H/o SCC A/P/next steps:  Problem List Items Addressed This Visit       Other   Depression - Primary    worsened of late and triggered by move. Increase celexa from 10mg  to 20mg  after baseline EKG performed 06/15/21 during nurse visit. Close follow up.       Relevant Medications   citalopram (CELEXA) 20 MG tablet   Low back pain    Unable to assess patient in person today.  Description of pain raises suspicion for spinal stenosis, degenerative disc disease, trochanteric bursitis.  Pain is improving and patient declines any imaging at this time.  Advised that she may use Tylenol arthritis scheduled doses.  If pain were to persist advised  to schedule in person exam, imaging.        I spent 18 min  discussing plan of care over the phone.

## 2021-06-13 NOTE — Patient Instructions (Addendum)
Increase celexa to 20mg  AFTER ekg this Friday 06/15/21 at 1:30 with nurse visit.   As discussed, let's start by scheduling Tylenol Arthritis which is a 650mg  tablet for back pain.   You may take 1-2 tablets every 8 hours ( scheduled) with maximum of 6 tablets per day.   For example , you could take two tablets in the morning ( 8am) and then two tablets again at 4pm.    Let me know how you are doing.

## 2021-06-13 NOTE — Telephone Encounter (Signed)
I called LM for patient letting her know that I have her scheduled 1:15p on Friday. I asked that she call back if this does not work.

## 2021-06-13 NOTE — Assessment & Plan Note (Signed)
worsened of late and triggered by move. Increase celexa from 10mg  to 20mg  after baseline EKG performed 06/15/21 during nurse visit. Close follow up.

## 2021-06-15 ENCOUNTER — Other Ambulatory Visit: Payer: Self-pay

## 2021-06-15 ENCOUNTER — Encounter: Payer: Self-pay | Admitting: Family

## 2021-06-15 ENCOUNTER — Ambulatory Visit (INDEPENDENT_AMBULATORY_CARE_PROVIDER_SITE_OTHER): Payer: Medicare Other | Admitting: Family

## 2021-06-15 VITALS — BP 92/68 | HR 84 | Temp 97.9°F | Ht 66.0 in | Wt 160.6 lb

## 2021-06-15 DIAGNOSIS — Z23 Encounter for immunization: Secondary | ICD-10-CM

## 2021-06-15 DIAGNOSIS — R9431 Abnormal electrocardiogram [ECG] [EKG]: Secondary | ICD-10-CM | POA: Diagnosis not present

## 2021-06-15 DIAGNOSIS — F32A Depression, unspecified: Secondary | ICD-10-CM | POA: Diagnosis not present

## 2021-06-15 DIAGNOSIS — Z8249 Family history of ischemic heart disease and other diseases of the circulatory system: Secondary | ICD-10-CM | POA: Diagnosis not present

## 2021-06-15 NOTE — Progress Notes (Signed)
Subjective:    Patient ID: Jill Ward, female    DOB: 11-19-1955, 65 y.o.   MRN: [id number]  CC: Jill Ward is a 65 y.o. female who presents today for follow up.   HPI: Here today for EKG.  We spoke 2 days ago about increasing Celexa from 10 mg to 20 mg. She denies chest pain, shortness of breath She has not had an EKG in the past. Father had history of heart disease,MI. Age of MI at 40, no heart disease prior.   She walks for exercise and no cp.    HISTORY:  Past Medical History:  Diagnosis Date   Allergy    Seasonal   Irritable bowel syndrome (IBS)    Meniere's disease    Migraines    Past Surgical History:  Procedure Laterality Date   BREAST CYST ASPIRATION     BREAST EXCISIONAL BIOPSY Left 2014   papilloma   BREAST SURGERY     CHOLECYSTECTOMY  1999   COLONOSCOPY  2004   Dr Bary Castilla   Sqamous Cancer Cell  2016   On chest   Family History  Problem Relation Age of Onset   Cancer Mother 22       bladder   Crohn's disease Mother    Dementia Father    Heart disease Father 14       Heart Attack   Ovarian cancer Paternal Grandmother 62   Breast cancer Maternal Aunt 16   Ovarian cancer Maternal Aunt 52    Allergies: Codeine Current Outpatient Medications on File Prior to Visit  Medication Sig Dispense Refill   cholecalciferol (VITAMIN D) 1000 UNITS tablet Take 1,000 Units by mouth daily. (Patient not taking: Reported on 02/09/2021)     citalopram (CELEXA) 20 MG tablet Take 1 tablet (20 mg total) by mouth daily. 90 tablet 3   No current facility-administered medications on file prior to visit.    Social History   Tobacco Use   Smoking status: Never   Smokeless tobacco: Never  Substance Use Topics   Alcohol use: No    Alcohol/week: 0.0 standard drinks   Drug use: No    Review of Systems  Constitutional:  Negative for chills and fever.  Respiratory:  Negative for cough and shortness of breath.   Cardiovascular:  Negative for chest pain and  palpitations.  Gastrointestinal:  Negative for nausea and vomiting.     Objective:    BP 92/68   Pulse 84   Temp 97.9 F (36.6 C) (Oral)   Ht 5\' 6"  (1.676 m)   Wt 160 lb 9.6 oz (72.8 kg)   SpO2 97%   BMI 25.92 kg/m  BP Readings from Last 3 Encounters:  06/15/21 92/68  02/09/21 100/68  01/26/21 99/70   Wt Readings from Last 3 Encounters:  06/15/21 160 lb 9.6 oz (72.8 kg)  02/09/21 158 lb (71.7 kg)  05/14/17 149 lb (67.6 kg)    Physical Exam Vitals reviewed.  Constitutional:      Appearance: She is well-developed.  Eyes:     Conjunctiva/sclera: Conjunctivae normal.  Cardiovascular:     Rate and Rhythm: Normal rate and regular rhythm.     Pulses: Normal pulses.     Heart sounds: Normal heart sounds.  Pulmonary:     Effort: Pulmonary effort is normal.     Breath sounds: Normal breath sounds. No wheezing, rhonchi or rales.  Skin:    General: Skin is warm and dry.  Neurological:  Mental Status: She is alert.  Psychiatric:        Speech: Speech normal.        Behavior: Behavior normal.        Thought Content: Thought content normal.       Assessment & Plan:   Problem List Items Addressed This Visit       Other   Abnormal EKG    No symptoms at this time.  Discussed family history of heart disease in her father's by in his 67s.  EKG shows sinus rhythm T wave inversions.  No acute ischemia and no prior EKG to compare to.  Consulted with colleague , Dr Nicki Reaper, and we jointly agreed that referral to cardiology for further restratification discussion of abnormal EKG advisable to evaluate for ischemia, ASCVD.  Advised patient of this, she will consider and let me know.      Depression - Primary    Uncontrolled. Advised to increase celexa to 20mg  as discussed 06/13/21. Will follow      Relevant Orders   EKG 12-Lead (Completed)   Family history of heart disease   Relevant Orders   Lipid panel   Other Visit Diagnoses     Need for immunization against influenza        Relevant Orders   Flu Vaccine QUAD 14mo+IM (Fluarix, Fluzone & Alfiuria Quad PF) (Completed)        I am having Arisbel B. Fertig maintain her cholecalciferol and citalopram.   No orders of the defined types were placed in this encounter.   Return precautions given.   Risks, benefits, and alternatives of the medications and treatment plan prescribed today were discussed, and patient expressed understanding.   Education regarding symptom management and diagnosis given to patient on AVS.  Continue to follow with Burnard Hawthorne, FNP for routine health maintenance.   Vickii Chafe and I agreed with plan.   Mable Paris, FNP

## 2021-06-18 ENCOUNTER — Telehealth: Payer: Self-pay | Admitting: Family

## 2021-06-18 DIAGNOSIS — R9431 Abnormal electrocardiogram [ECG] [EKG]: Secondary | ICD-10-CM | POA: Insufficient documentation

## 2021-06-18 NOTE — Assessment & Plan Note (Signed)
No symptoms at this time.  Discussed family history of heart disease in her father's by in his 47s.  EKG shows sinus rhythm T wave inversions.  No acute ischemia and no prior EKG to compare to.  Consulted with colleague , Dr Nicki Reaper, and we jointly agreed that referral to cardiology for further restratification discussion of abnormal EKG advisable to evaluate for ischemia, ASCVD.  Advised patient of this, she will consider and let me know.

## 2021-06-18 NOTE — Patient Instructions (Signed)
As discussed, I would advise consult with cardiology for further risk stratification based on abnormal EKG findings, family history.  Please let me know if you would consider

## 2021-06-18 NOTE — Assessment & Plan Note (Signed)
Uncontrolled. Advised to increase celexa to 20mg  as discussed 06/13/21. Will follow

## 2021-06-18 NOTE — Telephone Encounter (Signed)
Call pt I told pt on Friday that I would also speak with colleague in regard to my recommendation to see cardiology with nonspecific changes of EKG  I consulted with Dr Nicki Reaper and we jointly agreed that a baseline risk assessment is warranted. Cardiology  may discuss CT calcium score test, stress testing and/or echocardiogram  Would she consider referral to cardiology for nonspecific changes of EKG, risk stratification?

## 2021-06-19 NOTE — Telephone Encounter (Signed)
Pt is in agreement with the referral to Cardiology. However she stated that she will probably schedule the appt sometime after the new year.

## 2021-06-20 ENCOUNTER — Encounter: Payer: Self-pay | Admitting: Family

## 2021-06-20 NOTE — Addendum Note (Signed)
Addended by: Burnard Hawthorne on: 06/20/2021 08:56 AM   Modules accepted: Orders

## 2021-06-20 NOTE — Telephone Encounter (Signed)
Referral placed to cardiology 

## 2021-09-06 DIAGNOSIS — D2272 Melanocytic nevi of left lower limb, including hip: Secondary | ICD-10-CM | POA: Diagnosis not present

## 2021-09-06 DIAGNOSIS — L439 Lichen planus, unspecified: Secondary | ICD-10-CM | POA: Diagnosis not present

## 2021-09-06 DIAGNOSIS — D2261 Melanocytic nevi of right upper limb, including shoulder: Secondary | ICD-10-CM | POA: Diagnosis not present

## 2021-09-06 DIAGNOSIS — D2262 Melanocytic nevi of left upper limb, including shoulder: Secondary | ICD-10-CM | POA: Diagnosis not present

## 2021-09-06 DIAGNOSIS — D225 Melanocytic nevi of trunk: Secondary | ICD-10-CM | POA: Diagnosis not present

## 2021-10-24 ENCOUNTER — Telehealth: Payer: Self-pay | Admitting: Family

## 2021-10-24 NOTE — Telephone Encounter (Signed)
Copied from [place] [phone]. Topic: Medicare AWV ?>> Oct 24, 2021 11:18 AM Harris-Coley, Hannah Beat wrote: ?Reason for CRM: Left message for patient to schedule Annual Wellness Visit.  Please schedule with Nurse Health Advisor Denisa O'Brien-Blaney, LPN at [facility].  Please call [phone] ask for Juliann Pulse ?

## 2021-12-11 ENCOUNTER — Ambulatory Visit (INDEPENDENT_AMBULATORY_CARE_PROVIDER_SITE_OTHER): Payer: Medicare Other

## 2021-12-11 VITALS — Ht 66.0 in | Wt 160.0 lb

## 2021-12-11 DIAGNOSIS — Z0000 Encounter for general adult medical examination without abnormal findings: Secondary | ICD-10-CM

## 2021-12-11 NOTE — Patient Instructions (Addendum)
  Ms. Gritz , Thank you for taking time to come for your Medicare Wellness Visit. I appreciate your ongoing commitment to your health goals. Please review the following plan we discussed and let me know if I can assist you in the future.   These are the goals we discussed:  Goals      Increase physical activity     Walk for exercise Weight loss goal 140lb        This is a list of the screening recommended for you and due dates:  Health Maintenance  Topic Date Due   Pap Smear  02/09/2022   COVID-19 Vaccine (3 - Pfizer risk series) 12/27/2021*   Colon Cancer Screening  02/05/2022*   Hepatitis C Screening: USPSTF Recommendation to screen - Ages 18-79 yo.  02/05/2022*   Zoster (Shingles) Vaccine (1 of 2) 03/13/2022*   Flu Shot  02/05/2022   Mammogram  03/20/2023   Tetanus Vaccine  04/07/2024   Pneumonia Vaccine  Completed   DEXA scan (bone density measurement)  Completed   HPV Vaccine  Aged Out  *Topic was postponed. The date shown is not the original due date.

## 2021-12-11 NOTE — Progress Notes (Addendum)
Subjective:   Jill Ward is a 66 y.o. female who presents for an Initial Medicare Annual Wellness Visit.  Review of Systems    No ROS.  Medicare Wellness Virtual Visit.  Visual/audio telehealth visit, UTA vital signs.   See social history for additional risk factors.   Cardiac Risk Factors include: advanced age (>34mn, >>43women)     Objective:    Today's Vitals   12/11/21 1233  Weight: 160 lb (72.6 kg)  Height: '5\' 6"'$  (1.676 m)   Body mass index is 25.82 kg/m.     12/11/2021   12:48 PM  Advanced Directives  Does Patient Have a Medical Advance Directive? No  Would patient like information on creating a medical advance directive? No - Patient declined    Current Medications (verified) Outpatient Encounter Medications as of 12/11/2021  Medication Sig   cholecalciferol (VITAMIN D) 1000 UNITS tablet Take 1,000 Units by mouth daily. (Patient not taking: Reported on 02/09/2021)   citalopram (CELEXA) 20 MG tablet Take 1 tablet (20 mg total) by mouth daily.   No facility-administered encounter medications on file as of 12/11/2021.    Allergies (verified) Codeine   History: Past Medical History:  Diagnosis Date   Allergy    Seasonal   Irritable bowel syndrome (IBS)    Meniere's disease    Migraines    Past Surgical History:  Procedure Laterality Date   BREAST CYST ASPIRATION     BREAST EXCISIONAL BIOPSY Left 2014   papilloma   BREAST SURGERY     CHOLECYSTECTOMY  1999   COLONOSCOPY  2004   Dr BBary Castilla  Sqamous Cancer Cell  2016   On chest   Family History  Problem Relation Age of Onset   Cancer Mother 772      bladder   Crohn's disease Mother    Dementia Father    Heart disease Father 843      Heart Attack   Ovarian cancer Paternal Grandmother 627  Breast cancer Maternal Aunt 494  Ovarian cancer Maternal Aunt 52   Social History   Socioeconomic History   Marital status: Married    Spouse name: Not on file   Number of children: Not on file   Years  of education: Not on file   Highest education level: Not on file  Occupational History   Not on file  Tobacco Use   Smoking status: Never   Smokeless tobacco: Never  Substance and Sexual Activity   Alcohol use: No    Alcohol/week: 0.0 standard drinks   Drug use: No   Sexual activity: Never  Other Topics Concern   Not on file  Social History Narrative   Retired pAeronautical engineer   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, has recently closed down   Lives with husband and watches her grandchildren   4 children   Pets: None   Caffeine- 3 cans of diet coke    Social Determinants of Health   Financial Resource Strain: Low Risk    Difficulty of Paying Living Expenses: Not hard at all  Food Insecurity: No Food Insecurity   Worried About RCharity fundraiserin the Last Year: Never true   RArboriculturistin the Last Year: Never true  Transportation Needs: No Transportation Needs   Lack of Transportation (Medical): No   Lack of Transportation (Non-Medical): No  Physical Activity: Not on file  Stress: No Stress Concern Present   Feeling of Stress :  Not at all  Social Connections: Unknown   Frequency of Communication with Friends and Family: More than three times a week   Frequency of Social Gatherings with Friends and Family: More than three times a week   Attends Religious Services: Not on Electrical engineer or Organizations: Not on file   Attends Archivist Meetings: Not on file   Marital Status: Not on file    Tobacco Counseling Counseling given: Not Answered   Clinical Intake:  Pre-visit preparation completed: Yes        Diabetes: No  How often do you need to have someone help you when you read instructions, pamphlets, or other written materials from your doctor or pharmacy?: 1 - Never  Interpreter Needed?: No    Activities of Daily Living    12/11/2021   12:39 PM  In your present state of health, do you have any difficulty performing the following  activities:  Hearing? 0  Vision? 0  Difficulty concentrating or making decisions? 0  Walking or climbing stairs? 0  Dressing or bathing? 0  Doing errands, shopping? 0  Preparing Food and eating ? N  Using the Toilet? N  In the past six months, have you accidently leaked urine? N  Do you have problems with loss of bowel control? N  Managing your Medications? N  Managing your Finances? N  Housekeeping or managing your Housekeeping? N   Patient Care Team: Burnard Hawthorne, FNP as PCP - General (Family Medicine) Bary Castilla, Forest Gleason, MD (General Surgery) Dear, Trude Mcburney, MD (Inactive) (Family Medicine)  Indicate any recent Medical Services you may have received from other than [facility] providers in the past year (date may be approximate).     Assessment:   This is a routine wellness examination for Jill Ward.  Virtual Visit via Telephone Note  I connected with  Jill Ward on 12/11/21 at 12:30 PM EDT by telephone and verified that I am speaking with the correct person using two identifiers.  Persons participating in the virtual visit: patient/Nurse Health Advisor   I discussed the limitations of performing an evaluation and management service by telehealth. We continued and completed visit with audio only. Some vital signs may be absent or patient reported.   Hearing/Vision screen Hearing Screening - Comments:: Patient is able to hear conversational tones without difficulty.  No issues reported.   Vision Screening - Comments:: Followed by [facility] Wears readers only Cataract extraction, bilateral They have seen their ophthalmologist in the last 12 months.    Dietary issues and exercise activities discussed: Current Exercise Habits: Home exercise routine, Type of exercise: walking, Intensity: Mild    Goals Addressed             This Visit's Progress    Increase physical activity       Walk for exercise Weight loss goal 140lb       Depression Screen     12/11/2021   12:42 PM 06/13/2021   12:04 PM 11/13/2018    3:15 PM  PHQ 2/9 Scores  PHQ - 2 Score 0 1 1  PHQ- 9 Score  3 2    Fall Risk    12/11/2021   12:47 PM  Fall Risk   Falls in the past year? 0  Number falls in past yr: 0  Follow up Falls evaluation completed    FALL RISK PREVENTION PERTAINING TO THE HOME: Home free of loose throw rugs in walkways, pet  beds, electrical cords, etc? Yes  Adequate lighting in your home to reduce risk of falls? Yes   ASSISTIVE DEVICES UTILIZED TO PREVENT FALLS: Life alert? No  Use of a cane, walker or w/c? No   TIMED UP AND GO: Was the test performed? Yes .   Cognitive Function:  Patient is alert and oriented x3.  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    Education has been provided regarding the importance of this vaccine. Patient has been advised to call insurance company to determine out of pocket expense if they have not yet received this vaccine. Advised may also receive vaccine at local pharmacy or Health Dept. Verbalized acceptance and understanding.  Screening Tests Health Maintenance  Topic Date Due   PAP SMEAR-Modifier  02/09/2022   COVID-19 Vaccine (3 - Pfizer risk series) 12/27/2021 (Originally 11/16/2019)   COLONOSCOPY (Pts 45-34yr Insurance coverage will need to be confirmed)  02/05/2022 (Originally 11/03/2000)   Hepatitis C Screening  02/05/2022 (Originally 11/03/1973)   Zoster Vaccines- Shingrix (1 of 2) 03/13/2022 (Originally 11/04/1974)   INFLUENZA VACCINE  02/05/2022   MAMMOGRAM  03/20/2023   TETANUS/TDAP  04/07/2024   Pneumonia Vaccine 66 Years old  Completed   DEXA SCAN  Completed   HPV VACCINES  Aged Out   Health  Maintenance Health Maintenance Due  Topic Date Due   PAP SMEAR-Modifier  02/09/2022   Colonoscopy- deferred for further discussion with PCP per patient preference.   Lung Cancer Screening: (Low Dose CT Chest recommended if Age 66-80years, 30 pack-year currently smoking OR have quit w/in 15years.) does not qualify.   Hepatitis C Screening: Deferred for further discussion  with PCP per patient preference.    Vision Screening: Recommended annual ophthalmology exams for early detection of glaucoma and other disorders of the eye.  Dental Screening: Recommended annual dental exams for proper oral hygiene  Community Resource Referral / Chronic Care Management: CRR required this visit?  No   CCM required this visit?  No      Plan:   Keep all routine maintenance appointments.   I have personally reviewed and noted the following in the patient's chart:   Medical and social history Use of alcohol, tobacco or illicit drugs  Current medications and supplements including opioid prescriptions. Patient is not currently taking opioid prescriptions. Functional ability and status Nutritional status Physical activity Advanced directives List of other physicians Hospitalizations, surgeries, and ER visits in previous 12 months Vitals Screenings to include cognitive, depression, and falls Referrals and appointments  In addition, I have reviewed and discussed with patient certain preventive protocols, quality metrics, and best practice recommendations. A written personalized care plan for preventive services as well as general preventive health recommendations were provided to patient.     OVarney Biles LPN   [id number]

## 2022-01-21 ENCOUNTER — Ambulatory Visit: Payer: Medicare Other | Admitting: Dermatology

## 2022-01-28 ENCOUNTER — Ambulatory Visit: Payer: Medicare Other | Admitting: Dermatology

## 2022-02-13 ENCOUNTER — Ambulatory Visit: Payer: Medicare Other | Admitting: Family

## 2022-04-23 ENCOUNTER — Other Ambulatory Visit: Payer: Self-pay | Admitting: Family

## 2022-04-23 DIAGNOSIS — Z1231 Encounter for screening mammogram for malignant neoplasm of breast: Secondary | ICD-10-CM

## 2022-05-02 ENCOUNTER — Ambulatory Visit
Admission: EM | Admit: 2022-05-02 | Discharge: 2022-05-02 | Disposition: A | Discharge status: Home / Self Care | Payer: Medicare Other | Attending: Emergency Medicine | Admitting: Emergency Medicine

## 2022-05-02 DIAGNOSIS — J209 Acute bronchitis, unspecified: Secondary | ICD-10-CM | POA: Diagnosis not present

## 2022-05-02 MED ORDER — BENZONATATE 100 MG PO CAPS: 100.0000 mg | ORAL_CAPSULE | Freq: Three times a day (TID) | ORAL | 0 refills | Status: DC | PRN

## 2022-05-02 MED ORDER — AZITHROMYCIN 250 MG PO TABS: 250.0000 mg | ORAL_TABLET | Freq: Every day | ORAL | 0 refills | Status: DC

## 2022-05-02 NOTE — Discharge Instructions (Addendum)
Take the Zithromax and Tessalon Perles as directed.    Follow up with your primary care provider if your symptoms are not improving.     

## 2022-05-02 NOTE — ED Provider Notes (Signed)
Jill Ward    CSN: [id number] Arrival date & time: 05/02/22  1618      History   Chief Complaint Chief Complaint  Patient presents with   Cough    HPI ANYSIA Ward is Jill 66 y.o. female.  Patient presents with 2 to 3-week history of congestion and cough.  The cough is primarily nonproductive.  Jill Ward also has fatigue, especially as the day progresses.  No fever, rash, sore throat, chest pain, shortness of breath, vomiting, diarrhea, or other symptoms.  Treatment at home with Mucinex.  Her medical history includes allergies, migraine headaches, IBS.    The history is provided by the patient and medical records.    Past Medical History:  Diagnosis Date   Allergy    Seasonal   Irritable bowel syndrome (IBS)    Meniere's disease    Migraines     Patient Active Problem List   Diagnosis Date Noted   Abnormal EKG 06/18/2021   Family history of heart disease 06/15/2021   Low back pain 06/13/2021   Screening for breast cancer 11/16/2018   Conjunctivitis 11/26/2016   Depression 04/02/2016   Osteoarthritis 04/02/2016   Annual physical exam 02/23/2015   IBS (irritable bowel syndrome) 02/23/2015   Squamous cell carcinoma 02/23/2015   Environmental allergies 02/23/2015   Breast lump in female 01/17/2013    Past Surgical History:  Procedure Laterality Date   BREAST CYST ASPIRATION     BREAST EXCISIONAL BIOPSY Left 2014   papilloma   BREAST SURGERY     CHOLECYSTECTOMY  1999   COLONOSCOPY  2004   Dr Bary Castilla   Sqamous Cancer Cell  2016   On chest    OB History     Gravida  4   Para  4   Term      Preterm      AB      Living         SAB      IAB      Ectopic      Multiple      Live Births           Obstetric Comments  1st Menstrual Cycle:  15 1st Pregnancy:  23          Home Medications    Prior to Admission medications   Medication Sig Start Date End Date Taking? Authorizing Provider  azithromycin (ZITHROMAX) 250 MG tablet  Take 1 tablet (250 mg total) by mouth daily. Take first 2 tablets together, then 1 every day until finished. 05/02/22  Yes Sharion Balloon, NP  benzonatate (TESSALON) 100 MG capsule Take 1 capsule (100 mg total) by mouth 3 (three) times daily as needed for cough. 05/02/22  Yes Sharion Balloon, NP  cholecalciferol (VITAMIN D) 1000 UNITS tablet Take 1,000 Units by mouth daily. Patient not taking: Reported on 02/09/2021    [provider]  citalopram (CELEXA) 20 MG tablet Take 1 tablet (20 mg total) by mouth daily. 06/13/21   Burnard Hawthorne, FNP    Family History Family History  Problem Relation Age of Onset   Cancer Mother 16       bladder   Crohn's disease Mother    Dementia Father    Heart disease Father 46       Heart Attack   Ovarian cancer Paternal Grandmother 63   Breast cancer Maternal Aunt 41   Ovarian cancer Maternal Aunt 52    Social History Social History  Tobacco Use   Smoking status: Never   Smokeless tobacco: Never  Substance Use Topics   Alcohol use: No    Alcohol/week: 0.0 standard drinks of alcohol   Drug use: No     Allergies   Codeine   Review of Systems Review of Systems  Constitutional:  Positive for fatigue. Negative for chills and fever.  HENT:  Positive for congestion. Negative for ear pain and sore throat.   Respiratory:  Positive for cough. Negative for shortness of breath.   Cardiovascular:  Negative for chest pain and palpitations.  Gastrointestinal:  Negative for diarrhea and vomiting.  Skin:  Negative for rash.  All other systems reviewed and are negative.    Physical Exam Triage Vital Signs ED Triage Vitals [05/02/22 1630]  Enc Vitals Group     BP      Pulse Rate 87     Resp 18     Temp 98.2 F (36.8 C)     Temp src      SpO2 96 %     Weight      Height      Head Circumference      Peak Flow      Pain Score      Pain Loc      Pain Edu?      Excl. in [place]?    No data found.  Updated Vital Signs BP 108/74    Pulse 87   Temp 98.2 F (36.8 C)   Resp 18   Ht 5' 5.5" (1.664 m)   Wt 140 lb (63.5 kg)   SpO2 96%   BMI 22.94 kg/m   Visual Acuity Right Eye Distance:   Left Eye Distance:   Bilateral Distance:    Right Eye Near:   Left Eye Near:    Bilateral Near:     Physical Exam Vitals and nursing note reviewed.  Constitutional:      General: Jill Ward is not in acute distress.    Appearance: Normal appearance. Jill Ward is well-developed. Jill Ward is not ill-appearing.  HENT:     Right Ear: Tympanic membrane normal.     Left Ear: Tympanic membrane normal.     Nose: Nose normal.     Mouth/Throat:     Mouth: Mucous membranes are moist.     Pharynx: Oropharynx is clear.  Cardiovascular:     Rate and Rhythm: Normal rate and regular rhythm.     Heart sounds: Normal heart sounds.  Pulmonary:     Effort: Pulmonary effort is normal. No respiratory distress.     Breath sounds: Normal breath sounds.  Musculoskeletal:     Cervical back: Neck supple.  Skin:    General: Skin is warm and dry.  Neurological:     Mental Status: Jill Ward is alert.  Psychiatric:        Mood and Affect: Mood normal.        Behavior: Behavior normal.      UC Treatments / Results  Labs (all labs ordered are listed, but only abnormal results are displayed) Labs Reviewed - No data to display  EKG   Radiology No results found.  Procedures Procedures (including critical care time)  Medications Ordered in UC Medications - No data to display  Initial Impression / Assessment and Plan / UC Course  Ward have reviewed the triage vital signs and the nursing notes.  Pertinent labs & imaging results that were available during my care of the patient were reviewed by me  and considered in my medical decision making (see chart for details).   Acute bronchitis.  Patient has been symptomatic for 3 weeks and is not improving with OTC treatment.  No history of lung disease.  No respiratory distress and O2 sat is 96% on room air.  Treating  today with Zithromax and Tessalon Perles.  Instructed patient to follow up with her PCP if her symptoms are not improving.  Jill Ward agrees to plan of care.     Final Clinical Impressions(s) / UC Diagnoses   Final diagnoses:  Acute bronchitis, unspecified organism     Discharge Instructions      Take the Zithromax and Tessalon Perles as directed.  Follow up with your primary care provider if your symptoms are not improving.        ED Prescriptions     Medication Sig Dispense Auth. Provider   azithromycin (ZITHROMAX) 250 MG tablet Take 1 tablet (250 mg total) by mouth daily. Take first 2 tablets together, then 1 every day until finished. 6 tablet Sharion Balloon, NP   benzonatate (TESSALON) 100 MG capsule Take 1 capsule (100 mg total) by mouth 3 (three) times daily as needed for cough. 21 capsule Sharion Balloon, NP      PDMP not reviewed this encounter.   Sharion Balloon, NP 05/02/22 1710

## 2022-05-02 NOTE — ED Triage Notes (Signed)
Patient to Urgent Care with complaints of cough x2.5 weeks. Patient reports fatigue, headaches and wheezing. States she can hear her chest rattling, cough was productive but is now dry. Denies any known fevers.  Has been taking Mucinex severe cough and cold, zinc, and tylenol.

## 2022-05-03 ENCOUNTER — Other Ambulatory Visit: Payer: Self-pay | Admitting: Family

## 2022-05-03 DIAGNOSIS — F32A Depression, unspecified: Secondary | ICD-10-CM

## 2022-05-24 ENCOUNTER — Ambulatory Visit
Admission: RE | Admit: 2022-05-24 | Discharge: 2022-05-24 | Disposition: A | Discharge status: Home / Self Care | Payer: Medicare Other | Source: Ambulatory Visit | Attending: Family | Admitting: Family

## 2022-05-24 DIAGNOSIS — Z1231 Encounter for screening mammogram for malignant neoplasm of breast: Secondary | ICD-10-CM | POA: Diagnosis not present

## 2022-07-10 IMAGING — MG MM DIGITAL SCREENING BILAT W/ TOMO AND CAD
8 series · 8 of 24 positions shown · non-contrast
Comparison: Previous exam(s).

CLINICAL DATA: Screening.

EXAM:
DIGITAL SCREENING BILATERAL MAMMOGRAM WITH TOMOSYNTHESIS AND CAD
TECHNIQUE: Bilateral screening digital craniocaudal and mediolateral oblique
mammograms were obtained. Bilateral screening digital breast
tomosynthesis was performed. The images were evaluated with
computer-aided detection.

[L CC synth-2D]
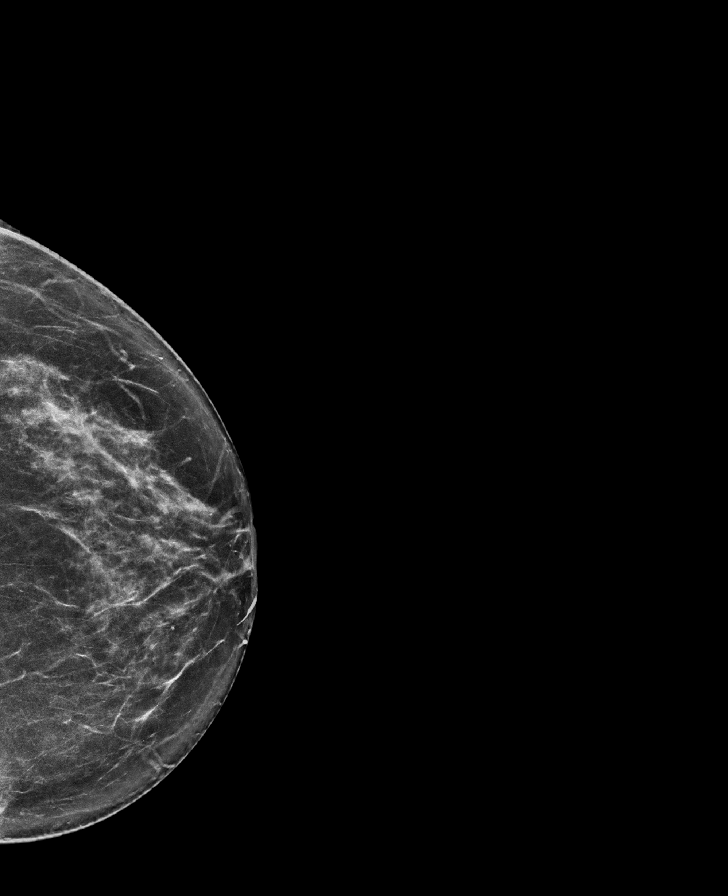

[R CC synth-2D]
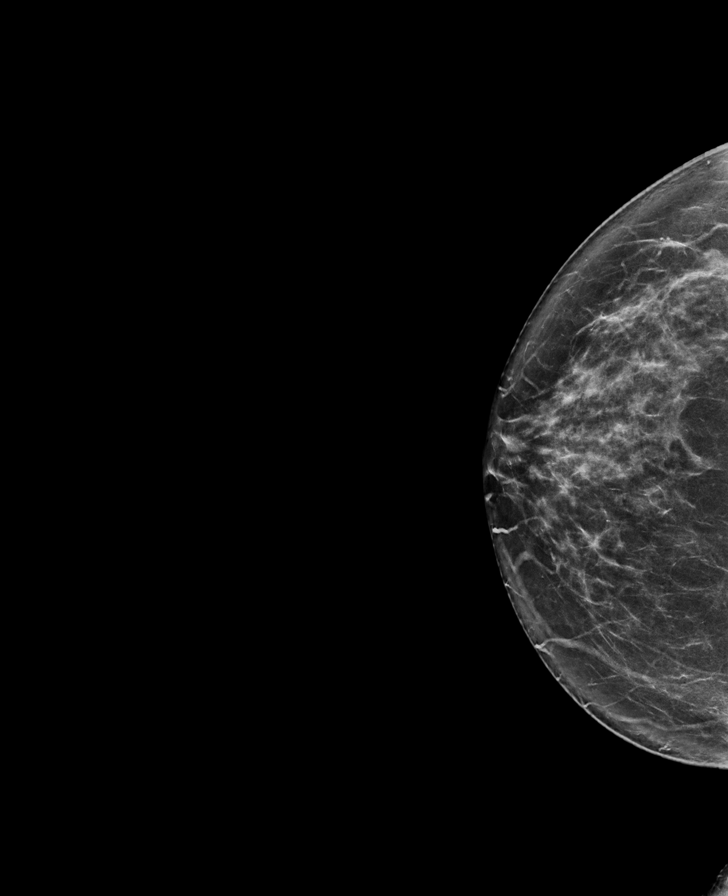

[R MLO synth-2D]
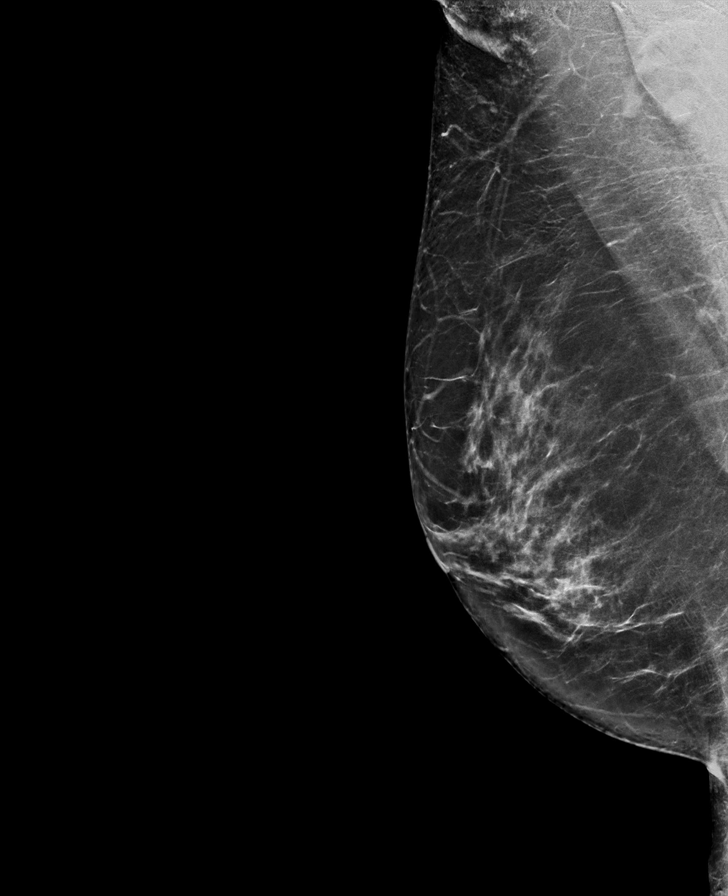

[L MLO synth-2D]
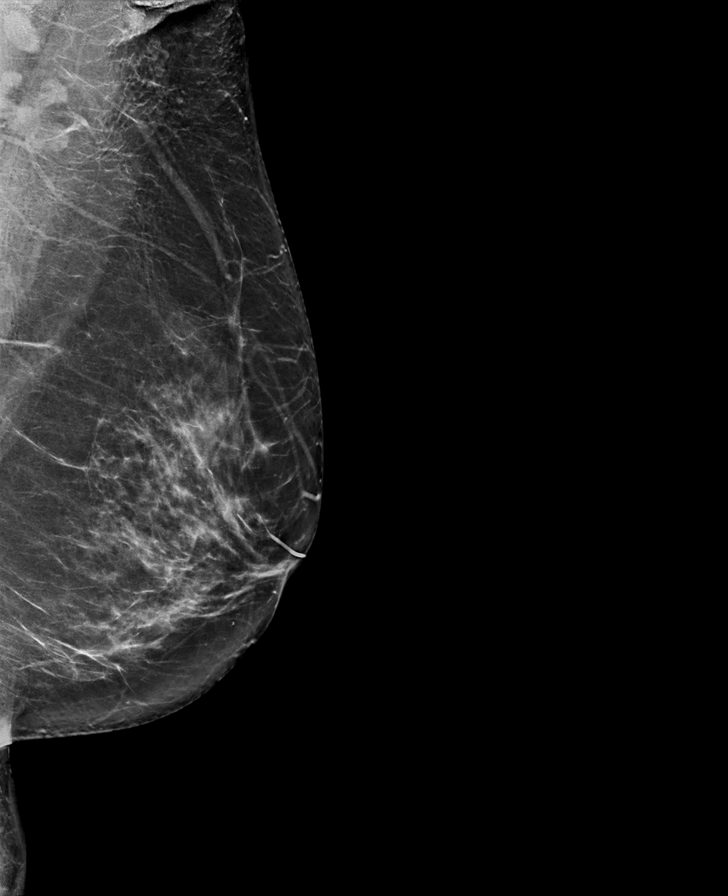

[R MLO tomo · tomo slice 43/85.0]
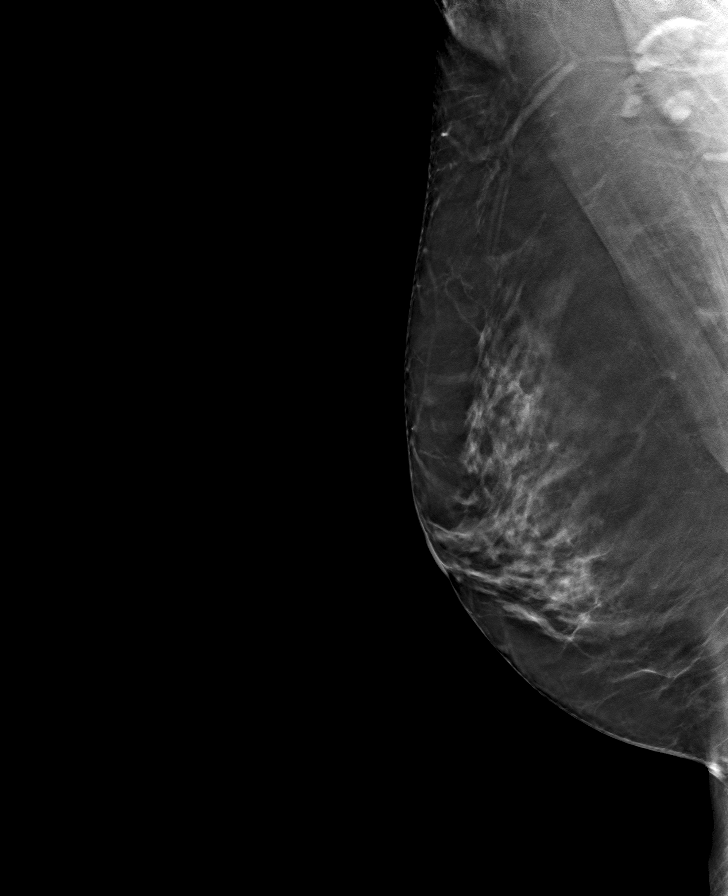

[L MLO tomo · tomo slice 43/84.0]
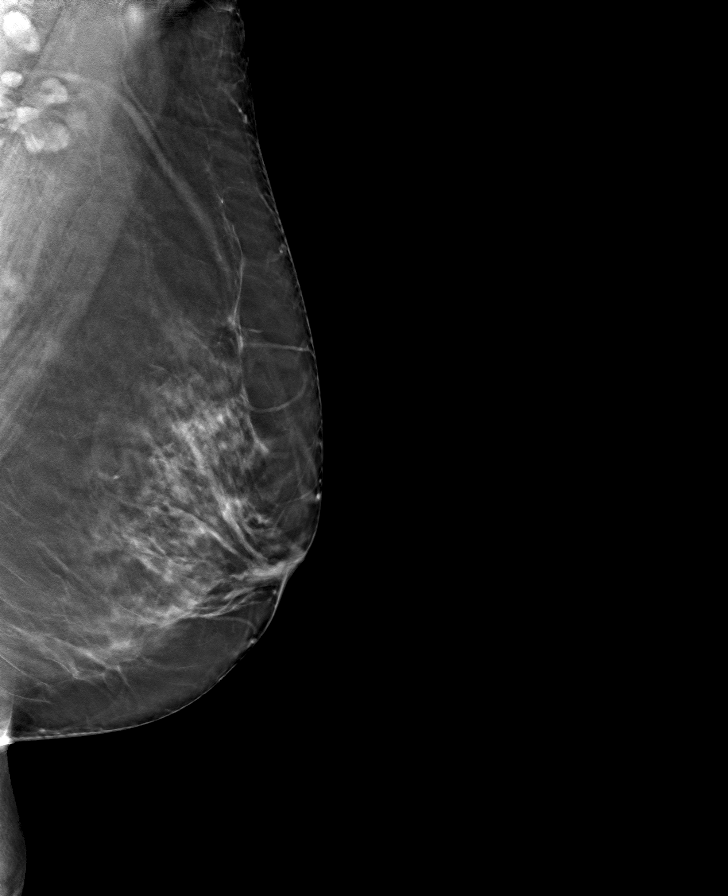

[R CC tomo · tomo slice 41/81.0]
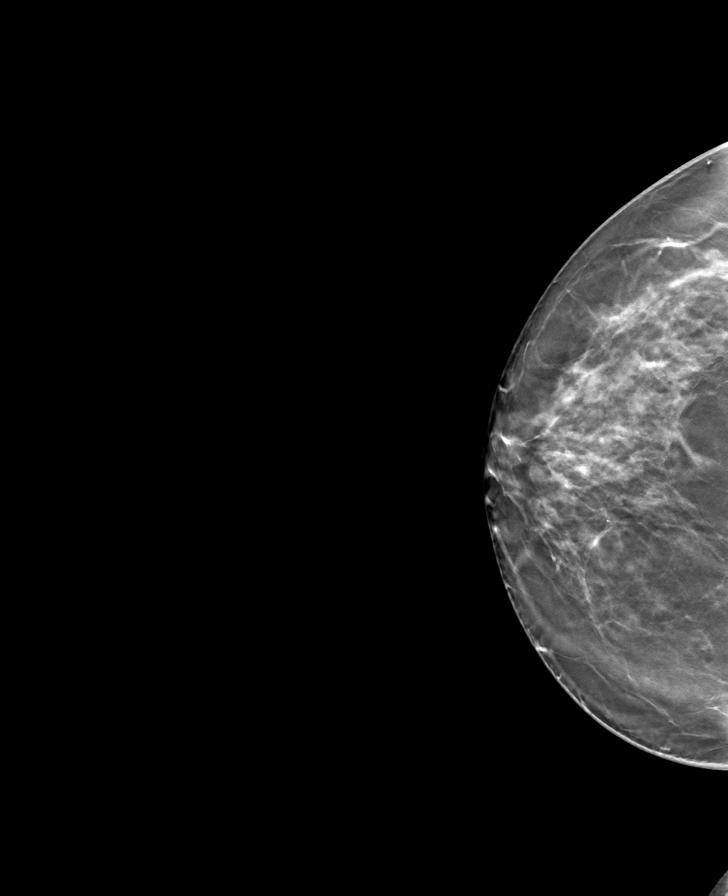

[L CC tomo · tomo slice 40/79.0]
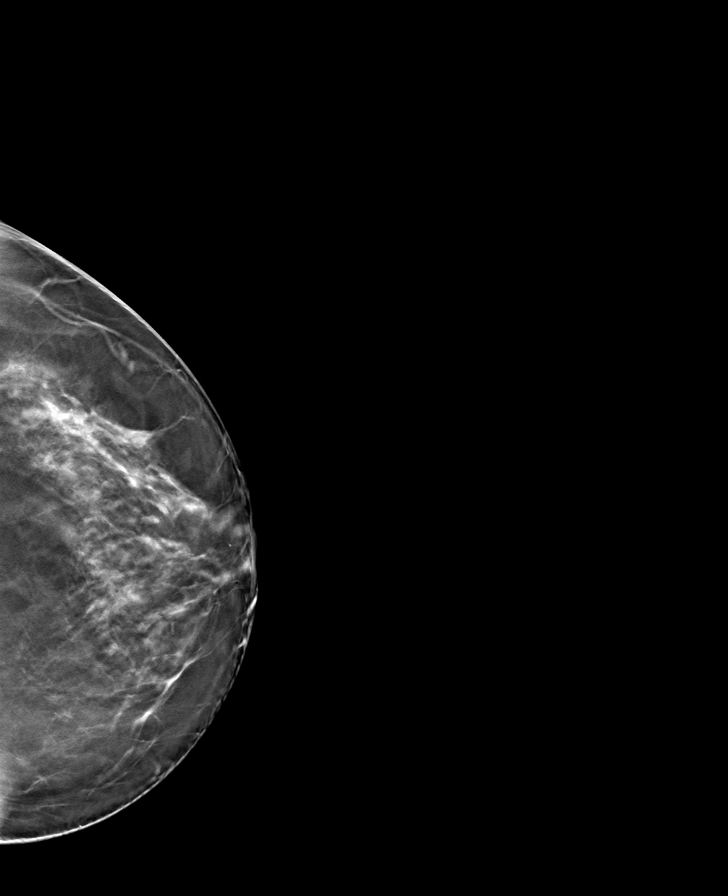

[8 of 24 positions shown; findings below may reference images not displayed]

ACR Breast Density Category c: The breast tissue is heterogeneously
dense, which may obscure small masses.
FINDINGS: There are no findings suspicious for malignancy.
IMPRESSION: No mammographic evidence of malignancy. A result letter of this
screening mammogram will be mailed directly to the patient.

RECOMMENDATION:
Screening mammogram in one year. (Code:Q3-W-BC3)

BI-RADS CATEGORY  1: Negative.

## 2022-08-06 ENCOUNTER — Ambulatory Visit (INDEPENDENT_AMBULATORY_CARE_PROVIDER_SITE_OTHER): Payer: Medicare Other

## 2022-08-06 ENCOUNTER — Encounter: Payer: Self-pay | Admitting: Family

## 2022-08-06 ENCOUNTER — Ambulatory Visit (INDEPENDENT_AMBULATORY_CARE_PROVIDER_SITE_OTHER): Payer: Medicare Other | Admitting: Family

## 2022-08-06 VITALS — BP 110/70 | HR 67 | Temp 97.7°F | Ht 66.0 in | Wt 160.0 lb

## 2022-08-06 DIAGNOSIS — R1030 Lower abdominal pain, unspecified: Secondary | ICD-10-CM | POA: Diagnosis not present

## 2022-08-06 DIAGNOSIS — M545 Low back pain, unspecified: Secondary | ICD-10-CM | POA: Diagnosis not present

## 2022-08-06 DIAGNOSIS — Z23 Encounter for immunization: Secondary | ICD-10-CM | POA: Diagnosis not present

## 2022-08-06 DIAGNOSIS — R1031 Right lower quadrant pain: Secondary | ICD-10-CM

## 2022-08-06 DIAGNOSIS — R5383 Other fatigue: Secondary | ICD-10-CM | POA: Diagnosis not present

## 2022-08-06 DIAGNOSIS — M160 Bilateral primary osteoarthritis of hip: Secondary | ICD-10-CM | POA: Diagnosis not present

## 2022-08-06 DIAGNOSIS — F32A Depression, unspecified: Secondary | ICD-10-CM

## 2022-08-06 DIAGNOSIS — Z1211 Encounter for screening for malignant neoplasm of colon: Secondary | ICD-10-CM

## 2022-08-06 DIAGNOSIS — M47816 Spondylosis without myelopathy or radiculopathy, lumbar region: Secondary | ICD-10-CM | POA: Diagnosis not present

## 2022-08-06 MED ORDER — CITALOPRAM HYDROBROMIDE 10 MG PO TABS: 10.0000 mg | ORAL_TABLET | Freq: Every day | ORAL | 3 refills | Status: DC

## 2022-08-06 MED ORDER — MELOXICAM 7.5 MG PO TABS: 7.5000 mg | ORAL_TABLET | Freq: Every day | ORAL | 2 refills | Status: DC | PRN

## 2022-08-06 NOTE — Assessment & Plan Note (Signed)
Start meloxicam 7.5 mg as needed. Consider tramadol in ineffective.  Consider PT, orthopedic consult.

## 2022-08-06 NOTE — Assessment & Plan Note (Signed)
Etiology unclear at this time.  Suspect lack of exercise during due to current hip pain is likely contributing. Sleep is restorative.  Pending lab evaluation.

## 2022-08-06 NOTE — Progress Notes (Unsigned)
   Assessment & Plan:  There are no diagnoses linked to this encounter.   Return precautions given.   Risks, benefits, and alternatives of the medications and treatment plan prescribed today were discussed, and patient expressed understanding.   Education regarding symptom management and diagnosis given to patient on AVS either electronically or printed.  No follow-ups on file.  Mable Paris, FNP  Subjective:    Patient ID: Jill Ward, female    DOB: [birth year], 67 y.o.   MRN: [id number]  CC: Jill Ward is a 67 y.o. female who presents today for follow up.   HPI: Complains of right groin pain for months. come and go.  [place] style is hard to do. Can feel when lifting leg or moves  Pain can radiate to right thigh. She is lifting  Using heat and ibuprofen No saddle anesthesia.   Complains of fatigue in the afternoon.   Sleep is well.  No       No h/o seizure  Allergies: Codeine Current Outpatient Medications on File Prior to Visit  Medication Sig Dispense Refill   azithromycin (ZITHROMAX) 250 MG tablet Take 1 tablet (250 mg total) by mouth daily. Take first 2 tablets together, then 1 every day until finished. 6 tablet 0   benzonatate (TESSALON) 100 MG capsule Take 1 capsule (100 mg total) by mouth 3 (three) times daily as needed for cough. 21 capsule 0   cholecalciferol (VITAMIN D) 1000 UNITS tablet Take 1,000 Units by mouth daily. (Patient not taking: Reported on 02/09/2021)     citalopram (CELEXA) 20 MG tablet TAKE ONE TABLET BY MOUTH EVERY DAY 90 tablet 3   No current facility-administered medications on file prior to visit.    Review of Systems    Objective:    There were no vitals taken for this visit. BP Readings from Last 3 Encounters:  05/02/22 108/74  06/15/21 92/68  02/09/21 100/68   Wt Readings from Last 3 Encounters:  05/02/22 140 lb (63.5 kg)  12/11/21 160 lb (72.6 kg)  06/15/21 160 lb 9.6 oz (72.8 kg)    Physical Exam

## 2022-08-06 NOTE — Patient Instructions (Signed)
Start meloxicam for hip pain. Consider tramadol if not effective.   A couple of points in regards to meloxicam ( Mobic) -  This medication is not intended for daily , long term use. It is a potent anti inflammatory ( NSAID), and my intention is for you take as needed for moderate to severe pain. If you find yourself using daily, please let me know.   Please takes Mobic ( meloxicam) with FOOD since it is an anti-inflammatory as it can cause a GI bleed or ulcer. If you have a history of GI bleed or ulcer, please do NOT take.  Do no take over the counter aleve, motrin, advil, goody's powder for pain as they are also NSAIDs, and they are  in the same class as Mobic  Lastly, we will need to monitor kidney function while on Mobic, and if we were to see any decline in kidney function in the future, we would have to discontinue this medication.    Please let me know when you are ready for me to refer you for colonoscopy  Nice to see you!

## 2022-08-07 ENCOUNTER — Other Ambulatory Visit: Payer: Self-pay | Admitting: Family

## 2022-08-07 DIAGNOSIS — E538 Deficiency of other specified B group vitamins: Secondary | ICD-10-CM

## 2022-08-07 LAB — CBC WITH DIFFERENTIAL/PLATELET
Basophils Absolute: 0.1 10*3/uL (ref 0.0–0.1)
Basophils Relative: 1 % (ref 0.0–3.0)
Eosinophils Absolute: 0.2 10*3/uL (ref 0.0–0.7)
Eosinophils Relative: 3 % (ref 0.0–5.0)
HCT: 38 % (ref 36.0–46.0)
Hemoglobin: 12.7 g/dL (ref 12.0–15.0)
Lymphocytes Relative: 37.5 % (ref 12.0–46.0)
Lymphs Abs: 2.4 10*3/uL (ref 0.7–4.0)
MCHC: 33.4 g/dL (ref 30.0–36.0)
MCV: 91.5 fl (ref 78.0–100.0)
Monocytes Absolute: 0.5 10*3/uL (ref 0.1–1.0)
Monocytes Relative: 8.5 % (ref 3.0–12.0)
Neutro Abs: 3.2 10*3/uL (ref 1.4–7.7)
Neutrophils Relative %: 50 % (ref 43.0–77.0)
Platelets: 331 10*3/uL (ref 150.0–400.0)
RBC: 4.16 Mil/uL (ref 3.87–5.11)
RDW: 13.4 % (ref 11.5–15.5)
WBC: 6.3 10*3/uL (ref 4.0–10.5)

## 2022-08-07 LAB — COMPREHENSIVE METABOLIC PANEL
ALT: 12 U/L (ref 0–35)
AST: 16 U/L (ref 0–37)
Albumin: 4.2 g/dL (ref 3.5–5.2)
Alkaline Phosphatase: 70 U/L (ref 39–117)
BUN: 9 mg/dL (ref 6–23)
CO2: 27 mEq/L (ref 19–32)
Calcium: 9.2 mg/dL (ref 8.4–10.5)
Chloride: 104 mEq/L (ref 96–112)
Creatinine, Ser: 0.81 mg/dL (ref 0.40–1.20)
GFR: 75.46 mL/min (ref >60.00)
Glucose, Bld: 84 mg/dL (ref 70–99)
Potassium: 4.1 mEq/L (ref 3.5–5.1)
Sodium: 139 mEq/L (ref 135–145)
Total Bilirubin: 0.4 mg/dL (ref 0.2–1.2)
Total Protein: 6.8 g/dL (ref 6.0–8.3)

## 2022-08-07 LAB — TSH: TSH: 2.25 u[IU]/mL (ref 0.35–5.50)

## 2022-08-07 LAB — HEMOGLOBIN A1C: Hgb A1c MFr Bld: 5.5 % (ref 4.6–6.5)

## 2022-08-07 LAB — B12 AND FOLATE PANEL
Folate: >23.8 ng/mL (ref >5.9)
Vitamin B-12: 327 pg/mL (ref 211–911)

## 2022-08-07 LAB — VITAMIN D 25 HYDROXY (VIT D DEFICIENCY, FRACTURES): VITD: 22.82 ng/mL — ABNORMAL LOW (ref 30.00–100.00)

## 2022-08-07 NOTE — Assessment & Plan Note (Addendum)
Excellent control.  We have decreased celexa 20 mg to 10 mg daily due to preference.   Will monitor

## 2022-08-13 ENCOUNTER — Ambulatory Visit (INDEPENDENT_AMBULATORY_CARE_PROVIDER_SITE_OTHER): Payer: Medicare Other

## 2022-08-13 DIAGNOSIS — E538 Deficiency of other specified B group vitamins: Secondary | ICD-10-CM

## 2022-08-13 MED ORDER — CYANOCOBALAMIN 1000 MCG/ML IJ SOLN
1000.0000 ug | Freq: Once | INTRAMUSCULAR | Status: AC
  Administered 2022-08-13: 1000 ug via INTRAMUSCULAR

## 2022-08-13 NOTE — Progress Notes (Signed)
Patient presented for B 12 injection to left deltoid, patient voiced no concerns nor showed any signs of distress during injection. 

## 2022-08-15 ENCOUNTER — Telehealth: Payer: Self-pay

## 2022-08-15 NOTE — Telephone Encounter (Signed)
LVM to call back to office to go over results

## 2022-08-20 ENCOUNTER — Ambulatory Visit (INDEPENDENT_AMBULATORY_CARE_PROVIDER_SITE_OTHER): Payer: Medicare Other

## 2022-08-20 DIAGNOSIS — E538 Deficiency of other specified B group vitamins: Secondary | ICD-10-CM

## 2022-08-20 MED ORDER — CYANOCOBALAMIN 1000 MCG/ML IJ SOLN
1000.0000 ug | Freq: Once | INTRAMUSCULAR | Status: AC
  Administered 2022-08-20: 1000 ug via INTRAMUSCULAR

## 2022-08-20 NOTE — Progress Notes (Signed)
Patient presented for B 12 injection to left deltoid, patient voiced no concerns nor showed any signs of distress during injection. 

## 2022-08-27 ENCOUNTER — Ambulatory Visit (INDEPENDENT_AMBULATORY_CARE_PROVIDER_SITE_OTHER): Payer: Medicare Other

## 2022-08-27 DIAGNOSIS — E538 Deficiency of other specified B group vitamins: Secondary | ICD-10-CM | POA: Diagnosis not present

## 2022-08-27 MED ORDER — CYANOCOBALAMIN 1000 MCG/ML IJ SOLN
1000.0000 ug | Freq: Once | INTRAMUSCULAR | Status: AC
  Administered 2022-08-27: 1000 ug via INTRAMUSCULAR

## 2022-08-27 NOTE — Progress Notes (Signed)
Patient presented for B 12 injection to left deltoid, patient voiced no concerns nor showed any signs of distress during injection. 

## 2022-09-03 ENCOUNTER — Ambulatory Visit (INDEPENDENT_AMBULATORY_CARE_PROVIDER_SITE_OTHER): Payer: Medicare Other

## 2022-09-03 ENCOUNTER — Telehealth: Payer: Self-pay

## 2022-09-03 DIAGNOSIS — E538 Deficiency of other specified B group vitamins: Secondary | ICD-10-CM

## 2022-09-03 MED ORDER — CYANOCOBALAMIN 1000 MCG/ML IJ SOLN
1000.0000 ug | Freq: Once | INTRAMUSCULAR | Status: AC
  Administered 2022-09-03: 1000 ug via INTRAMUSCULAR

## 2022-09-03 NOTE — Progress Notes (Signed)
Patient presented for her B12 injection. Patient was administered her B12 injection into her right deltoid. Patient tolerated the B12 injection well and did not show any signs of distress or voice any concerns.

## 2022-09-04 ENCOUNTER — Other Ambulatory Visit: Payer: Self-pay | Admitting: Family

## 2022-09-04 ENCOUNTER — Encounter: Payer: Self-pay | Admitting: Family

## 2022-09-04 DIAGNOSIS — R1031 Right lower quadrant pain: Secondary | ICD-10-CM

## 2022-09-04 MED ORDER — TRAMADOL HCL 50 MG PO TABS: 50.0000 mg | ORAL_TABLET | Freq: Two times a day (BID) | ORAL | 0 refills | Status: DC | PRN

## 2022-09-04 NOTE — Progress Notes (Signed)
Called in tramadol and prednisone to [facility]

## 2022-09-04 NOTE — Telephone Encounter (Signed)
Was this an error? I haved called pt and also sent her mychart message

## 2022-09-04 NOTE — Progress Notes (Signed)
[facility]ed into [facility]

## 2022-09-04 NOTE — Telephone Encounter (Signed)
error 

## 2022-09-04 NOTE — Progress Notes (Signed)
Called  into [facility]

## 2022-09-27 ENCOUNTER — Ambulatory Visit (INDEPENDENT_AMBULATORY_CARE_PROVIDER_SITE_OTHER): Payer: Medicare Other | Admitting: Family

## 2022-09-27 ENCOUNTER — Encounter: Payer: Self-pay | Admitting: Family

## 2022-09-27 VITALS — BP 112/78 | HR 86 | Temp 97.9°F | Ht 66.0 in | Wt 160.1 lb

## 2022-09-27 DIAGNOSIS — R1031 Right lower quadrant pain: Secondary | ICD-10-CM

## 2022-09-27 DIAGNOSIS — E538 Deficiency of other specified B group vitamins: Secondary | ICD-10-CM | POA: Diagnosis not present

## 2022-09-27 MED ORDER — GABAPENTIN 100 MG PO CAPS: 100.0000 mg | ORAL_CAPSULE | Freq: Every day | ORAL | 1 refills | Status: DC

## 2022-09-27 NOTE — Progress Notes (Unsigned)
   Assessment & Plan:  There are no diagnoses linked to this encounter.   Return precautions given.   Risks, benefits, and alternatives of the medications and treatment plan prescribed today were discussed, and patient expressed understanding.   Education regarding symptom management and diagnosis given to patient on AVS either electronically or printed.  No follow-ups on file.  Mable Paris, FNP  Subjective:    Patient ID: Jill Ward, female    DOB: 07-26-1955, 67 y.o.   MRN: [id number]  CC: Jill Ward is a 67 y.o. female who presents today for follow up.   HPI: Numbness front of right leg to knee It is positional  If moves in the wrong direciton amd tirmomh   Complains of continued groin pain.  Compliant with meloxicam 7.5 mg.  Completed prednisone 3 weeks ago with relief.    She is compliant with b12 IM in the office which has been helpful  X-ray lumbar 08/07/2022 vertebral body height is maintained.  Mild lumbar facet arthropathy.  Mild to space loss L2-L3.  Generative changes both hips   Allergies: Codeine Current Outpatient Medications on File Prior to Visit  Medication Sig Dispense Refill   cholecalciferol (VITAMIN D) 1000 UNITS tablet Take 1,000 Units by mouth daily.     citalopram (CELEXA) 10 MG tablet Take 1 tablet (10 mg total) by mouth daily. 90 tablet 3   meloxicam (MOBIC) 7.5 MG tablet Take 1 tablet (7.5 mg total) by mouth daily as needed for pain. 30 tablet 2   traMADol (ULTRAM) 50 MG tablet Take 1 tablet (50 mg total) by mouth every 12 (twelve) hours as needed for moderate pain. 20 tablet 0   No current facility-administered medications on file prior to visit.    Review of Systems    Objective:    BP 112/78   Pulse 86   Temp 97.9 F (36.6 C) (Oral)   Ht 5\' 6"  (1.676 m)   Wt 160 lb 1.6 oz (72.6 kg)   SpO2 95%   BMI 25.84 kg/m  BP Readings from Last 3 Encounters:  09/27/22 112/78  08/06/22 110/70  05/02/22 108/74   Wt Readings from  Last 3 Encounters:  09/27/22 160 lb 1.6 oz (72.6 kg)  08/06/22 160 lb (72.6 kg)  05/02/22 140 lb (63.5 kg)    Physical Exam

## 2022-09-27 NOTE — Patient Instructions (Signed)

## 2022-10-01 NOTE — Assessment & Plan Note (Addendum)
X-ray lumbar 08/07/2022 vertebral body height is maintained.  Mild lumbar facet arthropathy.  Mild to space loss L2-L3.  Degenerative changes both hips   Some improvement after prednisone, meloxicam.    Differential includes use trochanteric pain syndrome, sciatic nerve injury, adductor injury, degenerative labral tear, femoroacetabular impingement.  I suspect aggravated by underlying arthritic changes in bilateral hips, lumbar spine.  Fortunately x-ray does not reveal advanced avascular necrosis or calcification ( CPPD).  We agreed consult orthopedics and appropriate neck step.  We discussed pain management, Dr. Holley Raring versus [facility] versus [place] [facility].  Patient will consider these options and let me know.  In the meantime encouraged her to use Tramadol as needed for moderate to severe pain.

## 2022-10-01 NOTE — Assessment & Plan Note (Signed)
Fatigue is improved on B12.  Will continue for now.  Advised patient she will need further blood work to screen for pernicious anemia, celiac disease.  She will schedule this when she comes back to pick up CD of x-ray images

## 2022-10-02 NOTE — Progress Notes (Signed)
Left patient a voicemail to notify

## 2022-10-03 ENCOUNTER — Other Ambulatory Visit: Payer: Medicare Other

## 2022-10-03 ENCOUNTER — Ambulatory Visit: Payer: Medicare Other

## 2022-10-07 ENCOUNTER — Ambulatory Visit (INDEPENDENT_AMBULATORY_CARE_PROVIDER_SITE_OTHER): Payer: Medicare Other

## 2022-10-07 DIAGNOSIS — E538 Deficiency of other specified B group vitamins: Secondary | ICD-10-CM

## 2022-10-07 MED ORDER — CYANOCOBALAMIN 1000 MCG/ML IJ SOLN
1000.0000 ug | Freq: Once | INTRAMUSCULAR | Status: AC
  Administered 2022-10-07: 1000 ug via INTRAMUSCULAR

## 2022-10-07 NOTE — Progress Notes (Signed)
Pt presented for their vitamin B12 injection. Pt was identified through two identifiers. Pt tolerated shot well in their left  deltoid.  

## 2022-10-08 LAB — B12 AND FOLATE PANEL
Folate: >24 ng/mL
Vitamin B-12: 653 pg/mL (ref 200–1100)

## 2022-10-10 ENCOUNTER — Other Ambulatory Visit: Payer: Self-pay | Admitting: Family

## 2022-10-10 DIAGNOSIS — E538 Deficiency of other specified B group vitamins: Secondary | ICD-10-CM

## 2022-10-15 ENCOUNTER — Telehealth: Payer: Self-pay

## 2022-10-15 NOTE — Telephone Encounter (Signed)
CD burned & placed up front for pickup-Left voicemail to notify patient

## 2022-10-15 NOTE — Telephone Encounter (Signed)
Pt would like a CD of the xrays that were done in January to take with her to [facility].

## 2022-10-23 ENCOUNTER — Encounter: Payer: Self-pay | Admitting: Family

## 2022-10-24 ENCOUNTER — Other Ambulatory Visit: Payer: Self-pay | Admitting: Family

## 2022-10-24 DIAGNOSIS — R1031 Right lower quadrant pain: Secondary | ICD-10-CM

## 2022-10-29 DIAGNOSIS — M5416 Radiculopathy, lumbar region: Secondary | ICD-10-CM | POA: Diagnosis not present

## 2022-10-29 DIAGNOSIS — M1611 Unilateral primary osteoarthritis, right hip: Secondary | ICD-10-CM | POA: Diagnosis not present

## 2022-11-07 ENCOUNTER — Ambulatory Visit (INDEPENDENT_AMBULATORY_CARE_PROVIDER_SITE_OTHER): Payer: Medicare Other

## 2022-11-07 DIAGNOSIS — E538 Deficiency of other specified B group vitamins: Secondary | ICD-10-CM | POA: Diagnosis not present

## 2022-11-07 MED ORDER — CYANOCOBALAMIN 1000 MCG/ML IJ SOLN
1000.0000 ug | Freq: Once | INTRAMUSCULAR | Status: AC
Start: 2022-11-07 — End: 2022-11-07
  Administered 2022-11-07: 1000 ug via INTRAMUSCULAR

## 2022-11-07 NOTE — Progress Notes (Signed)
After obtaining consent, and per orders of Dr. Walsh, injection of B-12 given IM in right deltoid by Susane Bey Lynn. Patient tolerated injection well.  

## 2022-12-02 ENCOUNTER — Encounter: Payer: Self-pay | Admitting: Family

## 2022-12-03 ENCOUNTER — Other Ambulatory Visit: Payer: Self-pay | Admitting: Family

## 2022-12-03 DIAGNOSIS — R1031 Right lower quadrant pain: Secondary | ICD-10-CM

## 2022-12-03 MED ORDER — PREDNISONE 10 MG PO TABS
ORAL_TABLET | ORAL | 0 refills | Status: DC
Start: 2022-12-03 — End: 2023-07-30

## 2022-12-03 NOTE — Progress Notes (Signed)
close

## 2022-12-04 ENCOUNTER — Telehealth: Payer: Self-pay | Admitting: Family

## 2022-12-04 NOTE — Telephone Encounter (Signed)
Copied from CRM [id number]. Topic: Medicare AWV >> Dec 04, 2022  2:38 PM Payton Doughty wrote: Reason for CRM: LM 12/04/2022 to schedule AWV   Verlee Rossetti; Care Guide Ambulatory Clinical Support [place] [facility] Direct Dial: [phone]

## 2022-12-06 NOTE — Telephone Encounter (Signed)
FYI

## 2022-12-17 ENCOUNTER — Other Ambulatory Visit: Payer: Self-pay | Admitting: Family

## 2022-12-17 DIAGNOSIS — F418 Other specified anxiety disorders: Secondary | ICD-10-CM

## 2022-12-17 MED ORDER — ALPRAZOLAM 0.25 MG PO TABS
0.2500 mg | ORAL_TABLET | Freq: Every day | ORAL | 0 refills | Status: AC | PRN
Start: 2022-12-17 — End: ?

## 2023-03-13 ENCOUNTER — Telehealth: Payer: Self-pay | Admitting: Family

## 2023-03-13 NOTE — Telephone Encounter (Signed)
Copied from CRM [id number]. Topic: Medicare AWV >> Mar 13, 2023  9:35 AM Payton Doughty wrote: Reason for CRM: LM 03/13/2023 to schedule AWV   Verlee Rossetti; Care Guide Ambulatory Clinical Support [place] [facility] Direct Dial: [phone]

## 2023-03-25 ENCOUNTER — Telehealth: Payer: Self-pay | Admitting: Family

## 2023-03-25 NOTE — Telephone Encounter (Signed)
Copied from CRM [id number]. Topic: Medicare AWV >> Mar 25, 2023  2:49 PM Payton Doughty wrote: Reason for CRM: LM 03/25/2023 to schedule AWV   Verlee Rossetti; Care Guide Ambulatory Clinical Support [place] [facility] Direct Dial: [phone]

## 2023-04-22 ENCOUNTER — Other Ambulatory Visit: Payer: Self-pay | Admitting: Family

## 2023-04-22 ENCOUNTER — Ambulatory Visit
Admission: EM | Admit: 2023-04-22 | Discharge: 2023-04-22 | Disposition: A | Discharge status: Home / Self Care | Payer: Medicare Other | Attending: Emergency Medicine | Admitting: Emergency Medicine

## 2023-04-22 DIAGNOSIS — R051 Acute cough: Secondary | ICD-10-CM | POA: Insufficient documentation

## 2023-04-22 DIAGNOSIS — R1031 Right lower quadrant pain: Secondary | ICD-10-CM

## 2023-04-22 DIAGNOSIS — J069 Acute upper respiratory infection, unspecified: Secondary | ICD-10-CM

## 2023-04-22 MED ORDER — DOXYCYCLINE HYCLATE 100 MG PO CAPS: 100.0000 mg | ORAL_CAPSULE | Freq: Two times a day (BID) | ORAL | 0 refills | Status: DC

## 2023-04-22 MED ORDER — BENZONATATE 100 MG PO CAPS: 100.0000 mg | ORAL_CAPSULE | Freq: Three times a day (TID) | ORAL | 0 refills | Status: DC

## 2023-04-22 NOTE — ED Provider Notes (Addendum)
Jill Ward    CSN: [id number] Arrival date & time: 04/22/23  1047      History   Chief Complaint Chief Complaint  Patient presents with   Cough   Nasal Congestion    HPI Jill Ward is a 67 y.o. female.   Jill Ward is a 67 y.o. female.  Patient presents with 2 week history of congestion and cough.  The cough is primarily nonproductive.  She also has fatigue, especially as the day progresses.  No fever, rash, sore throat, chest pain, shortness of breath, vomiting, diarrhea, or other symptoms.  Treatment at home with Mucinex.  Her medical history includes allergies, migraine headaches, IBS.    Pt states she recently lost her husband and is just run down and cannot shake the cough, last incident was 1 year prior.   The history is provided by the patient. No language interpreter was used.    Past Medical History:  Diagnosis Date   Allergy    Seasonal   Irritable bowel syndrome (IBS)    Meniere's disease    Migraines     Patient Active Problem List   Diagnosis Date Noted   Acute URI 04/22/2023   B12 deficiency 08/07/2022   Fatigue 08/06/2022   Right groin pain 08/06/2022   Abnormal EKG 06/18/2021   Family history of heart disease 06/15/2021   Low back pain 06/13/2021   Screening for breast cancer 11/16/2018   Conjunctivitis 11/26/2016   Depression 04/02/2016   Osteoarthritis 04/02/2016   Acute cough 10/17/2015   Annual physical exam 02/23/2015   IBS (irritable bowel syndrome) 02/23/2015   Squamous cell carcinoma 02/23/2015   Environmental allergies 02/23/2015   Breast lump in female 01/17/2013    Past Surgical History:  Procedure Laterality Date   BREAST CYST ASPIRATION     BREAST EXCISIONAL BIOPSY Left 2014   papilloma   BREAST SURGERY     CHOLECYSTECTOMY  1999   COLONOSCOPY  2004   Dr Lemar Livings   Sqamous Cancer Cell  2016   On chest    OB History     Gravida  4   Para  4   Term      Preterm      AB      Living          SAB      IAB      Ectopic      Multiple      Live Births           Obstetric Comments  1st Menstrual Cycle:  15 1st Pregnancy:  23          Home Medications    Prior to Admission medications   Medication Sig Start Date End Date Taking? Authorizing Provider  benzonatate (TESSALON) 100 MG capsule Take 1 capsule (100 mg total) by mouth every 8 (eight) hours. 04/22/23  Yes Alisen Marsiglia, Para March, NP  doxycycline (VIBRAMYCIN) 100 MG capsule Take 1 capsule (100 mg total) by mouth 2 (two) times daily. 04/22/23  Yes Jessabelle Markiewicz, Para March, NP  ALPRAZolam (XANAX) 0.25 MG tablet Take 1 tablet (0.25 mg total) by mouth daily as needed for anxiety. 12/17/22   Allegra Grana, FNP  cholecalciferol (VITAMIN D) 1000 UNITS tablet Take 1,000 Units by mouth daily.    [provider]  citalopram (CELEXA) 10 MG tablet Take 1 tablet (10 mg total) by mouth daily. 08/06/22   Allegra Grana, FNP  gabapentin (NEURONTIN) 100 MG capsule TAKE  1 CAPSULE BY MOUTH AT BEDTIME 04/22/23   Allegra Grana, FNP  meloxicam (MOBIC) 7.5 MG tablet Take 1 tablet (7.5 mg total) by mouth daily as needed for pain. 08/06/22   Allegra Grana, FNP  predniSONE (DELTASONE) 10 MG tablet Take 40 mg by mouth on day 1, then taper 10 mg daily until gone 12/03/22   Allegra Grana, FNP  traMADol (ULTRAM) 50 MG tablet Take 1 tablet (50 mg total) by mouth every 12 (twelve) hours as needed for moderate pain. 09/04/22   Allegra Grana, FNP    Family History Family History  Problem Relation Age of Onset   Cancer Mother 32       bladder   Crohn's disease Mother    Dementia Father    Heart disease Father 73       Heart Attack   Ovarian cancer Paternal Grandmother 70   Breast cancer Maternal Aunt 40   Ovarian cancer Maternal Aunt 24    Social History Social History   Tobacco Use   Smoking status: Never   Smokeless tobacco: Never  Substance Use Topics   Alcohol use: No    Alcohol/week: 0.0 standard  drinks of alcohol   Drug use: No     Allergies   Codeine   Review of Systems Review of Systems  Constitutional:  Positive for fatigue. Negative for fever.  HENT:  Positive for congestion.   Respiratory:  Positive for cough.   All other systems reviewed and are negative.    Physical Exam Triage Vital Signs ED Triage Vitals  Encounter Vitals Group     BP 04/22/23 1201 121/79     Systolic BP Percentile --      Diastolic BP Percentile --      Pulse Rate 04/22/23 1201 84     Resp 04/22/23 1201 18     Temp 04/22/23 1201 98.5 F (36.9 C)     Temp src --      SpO2 04/22/23 1201 96 %     Weight --      Height --      Head Circumference --      Peak Flow --      Pain Score 04/22/23 1208 0     Pain Loc --      Pain Education --      Exclude from Growth Chart --    No data found.  Updated Vital Signs BP 121/79   Pulse 84   Temp 98.5 F (36.9 C)   Resp 18   SpO2 96%   Visual Acuity Right Eye Distance:   Left Eye Distance:   Bilateral Distance:    Right Eye Near:   Left Eye Near:    Bilateral Near:     Physical Exam Vitals and nursing note reviewed.  Cardiovascular:     Rate and Rhythm: Normal rate and regular rhythm.     Pulses: Normal pulses.     Heart sounds: Normal heart sounds.  Pulmonary:     Effort: Pulmonary effort is normal.     Breath sounds: Normal breath sounds and air entry.  Neurological:     General: No focal deficit present.     Mental Status: She is alert and oriented to person, place, and time.     GCS: GCS eye subscore is 4. GCS verbal subscore is 5. GCS motor subscore is 6.     Cranial Nerves: No cranial nerve deficit.     Sensory: No  sensory deficit.  Psychiatric:        Attention and Perception: Attention normal.        Mood and Affect: Mood normal.        Speech: Speech normal.        Behavior: Behavior normal.      UC Treatments / Results  Labs (all labs ordered are listed, but only abnormal results are displayed) Labs  Reviewed - No data to display  EKG   Radiology No results found.  Procedures Procedures (including critical care time)  Medications Ordered in UC Medications - No data to display  Initial Impression / Assessment and Plan / UC Course  I have reviewed the triage vital signs and the nursing notes.  Pertinent labs & imaging results that were available during my care of the patient were reviewed by me and considered in my medical decision making (see chart for details).     Ddx: URI,Cough,allergies Final Clinical Impressions(s) / UC Diagnoses   Final diagnoses:  Acute URI  Acute cough     Discharge Instructions      Rest,push fluids,take antibiotic as directed. Follow up with PCP. May take OTC meds for symptom management (flonase, tylenol,ibuprofen,chloraseptic, etc).      ED Prescriptions     Medication Sig Dispense Auth. Provider   benzonatate (TESSALON) 100 MG capsule Take 1 capsule (100 mg total) by mouth every 8 (eight) hours. 21 capsule Gabreil [place], NP   doxycycline (VIBRAMYCIN) 100 MG capsule Take 1 capsule (100 mg total) by mouth 2 (two) times daily. 20 capsule Aja Bolander, Para March, NP      PDMP not reviewed this encounter.   Clancy Gourd, NP 04/22/23 1600    Clancy Gourd, NP 04/22/23 1703

## 2023-04-22 NOTE — Discharge Instructions (Addendum)
Rest,push fluids,take antibiotic as directed. Follow up with PCP.May take OTC meds for symptom management (flonase, tylenol,ibuprofen,chloraseptic, etc).

## 2023-04-22 NOTE — ED Triage Notes (Signed)
Patient presents to UC for dry cough and nasal congestion x 2 weeks. Treating symptoms with mucinex DM.   Denies fever.

## 2023-04-23 ENCOUNTER — Ambulatory Visit: Payer: Medicare Other

## 2023-04-23 VITALS — Ht 66.0 in | Wt 155.0 lb

## 2023-04-23 DIAGNOSIS — Z1231 Encounter for screening mammogram for malignant neoplasm of breast: Secondary | ICD-10-CM

## 2023-04-23 DIAGNOSIS — Z0000 Encounter for general adult medical examination without abnormal findings: Secondary | ICD-10-CM | POA: Diagnosis not present

## 2023-04-23 DIAGNOSIS — Z780 Asymptomatic menopausal state: Secondary | ICD-10-CM | POA: Diagnosis not present

## 2023-04-23 NOTE — Progress Notes (Signed)
Subjective:   Jill Ward is a 67 y.o. female who presents for Medicare Annual (Subsequent) preventive examination.  Visit Complete: Virtual I connected with  Halina Andreas on 04/23/23 by a audio enabled telemedicine application and verified that I am speaking with the correct person using two identifiers.  Patient Location: Home  Provider Location: Office/Clinic  I discussed the limitations of evaluation and management by telemedicine. The patient expressed understanding and agreed to proceed.  Vital Signs: Because this visit was a virtual/telehealth visit, some criteria may be missing or patient reported. Any vitals not documented were not able to be obtained and vitals that have been documented are patient reported.   Cardiac Risk Factors include: advanced age (>65men, >24 women)     Objective:    Today's Vitals   04/23/23 1455 04/23/23 1456  Weight: 155 lb (70.3 kg)   Height: 5\' 6"  (1.676 m)   PainSc:  4    Body mass index is 25.02 kg/m.     04/23/2023    3:13 PM 05/02/2022    4:51 PM 12/11/2021   12:48 PM  Advanced Directives  Does Patient Have a Medical Advance Directive? No No No  Would patient like information on creating a medical advance directive? No - Patient declined  No - Patient declined    Current Medications (verified) Outpatient Encounter Medications as of 04/23/2023  Medication Sig   ALPRAZolam (XANAX) 0.25 MG tablet Take 1 tablet (0.25 mg total) by mouth daily as needed for anxiety.   benzonatate (TESSALON) 100 MG capsule Take 1 capsule (100 mg total) by mouth every 8 (eight) hours.   cholecalciferol (VITAMIN D) 1000 UNITS tablet Take 1,000 Units by mouth daily.   citalopram (CELEXA) 10 MG tablet Take 1 tablet (10 mg total) by mouth daily.   doxycycline (VIBRAMYCIN) 100 MG capsule Take 1 capsule (100 mg total) by mouth 2 (two) times daily.   gabapentin (NEURONTIN) 100 MG capsule TAKE 1 CAPSULE BY MOUTH AT BEDTIME   meloxicam (MOBIC) 7.5 MG  tablet Take 1 tablet (7.5 mg total) by mouth daily as needed for pain.   predniSONE (DELTASONE) 10 MG tablet Take 40 mg by mouth on day 1, then taper 10 mg daily until gone   traMADol (ULTRAM) 50 MG tablet Take 1 tablet (50 mg total) by mouth every 12 (twelve) hours as needed for moderate pain. (Patient not taking: Reported on 04/23/2023)   No facility-administered encounter medications on file as of 04/23/2023.    Allergies (verified) Codeine   History: Past Medical History:  Diagnosis Date   Allergy    Seasonal   Irritable bowel syndrome (IBS)    Meniere's disease    Migraines    Past Surgical History:  Procedure Laterality Date   BREAST CYST ASPIRATION     BREAST EXCISIONAL BIOPSY Left 2014   papilloma   BREAST SURGERY     CHOLECYSTECTOMY  1999   COLONOSCOPY  2004   Dr Lemar Livings   Sqamous Cancer Cell  2016   On chest   Family History  Problem Relation Age of Onset   Cancer Mother 16       bladder   Crohn's disease Mother    Dementia Father    Heart disease Father 71       Heart Attack   Ovarian cancer Paternal Grandmother 9   Breast cancer Maternal Aunt 40   Ovarian cancer Maternal Aunt 38   Social History   Socioeconomic History   Marital  status: Married    Spouse name: Not on file   Number of children: Not on file   Years of education: Not on file   Highest education level: Not on file  Occupational History   Not on file  Tobacco Use   Smoking status: Never   Smokeless tobacco: Never  Substance and Sexual Activity   Alcohol use: No    Alcohol/week: 0.0 standard drinks of alcohol   Drug use: No   Sexual activity: Never  Other Topics Concern   Not on file  Social History Narrative   Retired Administrator    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, has recently closed down   Lives with husband and watches her grandchildren   4 children   Pets: None   Caffeine- 3 cans of diet coke       Husband passed away Feb 28, 2023   Social Determinants of Health    Financial Resource Strain: Low Risk  (04/23/2023)   Overall Financial Resource Strain (CARDIA)    Difficulty of Paying Living Expenses: Not hard at all  Food Insecurity: No Food Insecurity (04/23/2023)   Hunger Vital Sign    Worried About Running Out of Food in the Last Year: Never true    Ran Out of Food in the Last Year: Never true  Transportation Needs: No Transportation Needs (04/23/2023)   PRAPARE - Administrator, Civil Service (Medical): No    Lack of Transportation (Non-Medical): No  Physical Activity: Inactive (04/23/2023)   Exercise Vital Sign    Days of Exercise per Week: 0 days    Minutes of Exercise per Session: 0 min  Stress: Stress Concern Present (04/23/2023)   [employer] of Occupational Health - Occupational Stress Questionnaire    Feeling of Stress : To some extent  Social Connections: Moderately Integrated (04/23/2023)   Social Connection and Isolation Panel [NHANES]    Frequency of Communication with Friends and Family: More than three times a week    Frequency of Social Gatherings with Friends and Family: More than three times a week    Attends Religious Services: More than 4 times per year    Active Member of [employer] or Organizations: Yes    Attends Banker Meetings: More than 4 times per year    Marital Status: Widowed    Tobacco Counseling Counseling given: Not Answered   Clinical Intake:  Pre-visit preparation completed: Yes  Pain : 0-10 Pain Score: 4  Pain Type: Chronic pain Pain Location: Leg Pain Orientation: Right Pain Descriptors / Indicators: Aching, Sharp Pain Onset: More than a month ago Pain Frequency: Constant     BMI - recorded: 25.02 Nutritional Status: BMI 25 -29 Overweight Nutritional Risks: None Diabetes: No  How often do you need to have someone help you when you read instructions, pamphlets, or other written materials from your doctor or pharmacy?: 1 - Never  Interpreter Needed?:  No  Information entered by :: R. Vanecia Limpert LPN   Activities of Daily Living    04/23/2023    2:59 PM  In your present state of health, do you have any difficulty performing the following activities:  Hearing? 0  Vision? 0  Comment readers  Difficulty concentrating or making decisions? 0  Walking or climbing stairs? 0  Dressing or bathing? 0  Doing errands, shopping? 0  Preparing Food and eating ? N  Using the Toilet? N  In the past six months, have you accidently leaked urine? N  Do you have  problems with loss of bowel control? N  Managing your Medications? N  Managing your Finances? N  Housekeeping or managing your Housekeeping? N    Patient Care Team: Allegra Grana, FNP as PCP - General (Family Medicine) Lemar Livings, Merrily Pew, MD (General Surgery) Dear, Earlyne Iba, MD (Inactive) (Family Medicine)  Indicate any recent Medical Services you may have received from other than [facility] providers in the past year (date may be approximate).     Assessment:   This is a routine wellness examination for [place].  Hearing/Vision screen Hearing Screening - Comments:: No issues Vision Screening - Comments:: Readers    Goals Addressed             This Visit's Progress    Patient Stated       Wants to lose some weight and start back walking and exercising       Depression Screen    04/23/2023    3:06 PM 08/06/2022    1:39 PM 12/11/2021   12:42 PM 06/13/2021   12:04 PM 11/13/2018    3:15 PM  PHQ 2/9 Scores  PHQ - 2 Score 1 0 0 1 1  PHQ- 9 Score 6   3 2     Fall Risk    04/23/2023    3:01 PM 08/06/2022    1:39 PM 12/11/2021   12:47 PM  Fall Risk   Falls in the past year? 0 0 0  Number falls in past yr: 0 0 0  Injury with Fall? 0 0   Risk for fall due to : No Fall Risks No Fall Risks   Follow up Falls prevention discussed;Falls evaluation completed Falls evaluation completed Falls evaluation completed    MEDICARE RISK AT HOME: Medicare Risk at Home Any stairs in or around  the home?: No If so, are there any without handrails?: No Home free of loose throw rugs in walkways, pet beds, electrical cords, etc?: Yes Adequate lighting in your home to reduce risk of falls?: Yes Life alert?: No Use of a cane, walker or w/c?: No Grab bars in the bathroom?: No Shower chair or bench in shower?: No Elevated toilet seat or a handicapped toilet?: No   Cognitive Function:        04/23/2023    3:15 PM  6CIT Screen  What Year? 0 points  What month? 0 points  What time? 0 points  Count back from 20 0 points  Months in reverse 0 points  Repeat phrase 0 points  Total Score 0 points    Immunizations Immunization History  Administered Date(s) Administered   Fluad Quad(high Dose 65+) 08/06/2022   Influenza Split 04/13/2018, 04/22/2019   Influenza,inj,Quad PF,6+ Mos 06/15/2021   PFIZER(Purple Top)SARS-COV-2 Vaccination 09/17/2019, 10/19/2019   PNEUMOCOCCAL CONJUGATE-20 02/09/2021    TDAP status: Due, Education has been provided regarding the importance of this vaccine. Advised may receive this vaccine at local pharmacy or Health Dept. Aware to provide a copy of the vaccination record if obtained from local pharmacy or Health Dept. Verbalized acceptance and understanding.  Flu Vaccine status: Due, Education has been provided regarding the importance of this vaccine. Advised may receive this vaccine at local pharmacy or Health Dept. Aware to provide a copy of the vaccination record if obtained from local pharmacy or Health Dept. Verbalized acceptance and understanding.  Pneumococcal vaccine status: Up to date  Covid-19 vaccine status: Information provided on how to obtain vaccines.   Qualifies for Shingles Vaccine? Yes   Zostavax completed No  Shingrix Completed?: No.    Education has been provided regarding the importance of this vaccine. Patient has been advised to call insurance company to determine out of pocket expense if they have not yet received this  vaccine. Advised may also receive vaccine at local pharmacy or Health Dept. Verbalized acceptance and understanding.  Screening Tests Health Maintenance  Topic Date Due   Hepatitis C Screening  Never done   DTaP/Tdap/Td (1 - Tdap) Never done   Zoster Vaccines- Shingrix (1 of 2) Never done   Colonoscopy  Never done   COVID-19 Vaccine (3 - Pfizer risk series) 11/16/2019   Cervical Cancer Screening (Pap smear)  02/09/2022   Medicare Annual Wellness (AWV)  12/12/2022   INFLUENZA VACCINE  02/06/2023   MAMMOGRAM  05/24/2024   Pneumonia Vaccine 67+ Years old  Completed   DEXA SCAN  Completed   HPV VACCINES  Aged Out    Health Maintenance  Health Maintenance Due  Topic Date Due   Hepatitis C Screening  Never done   DTaP/Tdap/Td (1 - Tdap) Never done   Zoster Vaccines- Shingrix (1 of 2) Never done   Colonoscopy  Never done   COVID-19 Vaccine (3 - Pfizer risk series) 11/16/2019   Cervical Cancer Screening (Pap smear)  02/09/2022   Medicare Annual Wellness (AWV)  12/12/2022   INFLUENZA VACCINE  02/06/2023   Colorectal cancer screening Wants to talk with PCP at next visit   Mammogram status: Completed 05/2022. Repeat every year Order placed today.  Bone Density Dexa Completed 03/2021 Normal. Repeat in 2 years. Order placed today  Lung Cancer Screening: (Low Dose CT Chest recommended if Age 66-80 years, 20 pack-year currently smoking OR have quit w/in 15years.) does not qualify.    Additional Screening:  Hepatitis C Screening: does qualify; Completed Patient declined  Vision Screening: Recommended annual ophthalmology exams for early detection of glaucoma and other disorders of the eye. Is the patient up to date with their annual eye exam?  No  Who is the provider or what is the name of the office in which the patient attends annual eye exams? [facility]. Patient will call and schedule an appointment If pt is not established with a provider, would they like to be referred to a  provider to establish care? No .   Dental Screening: Recommended annual dental exams for proper oral hygiene    Community Resource Referral / Chronic Care Management: CRR required this visit?  No   CCM required this visit?  No     Plan:     I have personally reviewed and noted the following in the patient's chart:   Medical and social history Use of alcohol, tobacco or illicit drugs  Current medications and supplements including opioid prescriptions. Patient is not currently taking opioid prescriptions. Functional ability and status Nutritional status Physical activity Advanced directives List of other physicians Hospitalizations, surgeries, and ER visits in previous 12 months Vitals Screenings to include cognitive, depression, and falls Referrals and appointments  In addition, I have reviewed and discussed with patient certain preventive protocols, quality metrics, and best practice recommendations. A written personalized care plan for preventive services as well as general preventive health recommendations were provided to patient.     Sydell Axon, LPN   [id number]   After Visit Summary: (MyChart) Due to this being a telephonic visit, the after visit summary with patients personalized plan was offered to patient via MyChart   Nurse Notes: None

## 2023-04-23 NOTE — Patient Instructions (Signed)
Ms. Jill Ward , Thank you for taking time to come for your Medicare Wellness Visit. I appreciate your ongoing commitment to your health goals. Please review the following plan we discussed and let me know if I can assist you in the future.   Referrals/Orders/Follow-Ups/Clinician Recommendations: Remember to update your vaccines, Mammogram and Bone Density ordered You have an order for:  []   2D Mammogram  [x]   3D Mammogram  [x]   Bone Density     Please call for appointment:  [facility]  [address] [place] [address] [phone]   Make sure to wear two-piece clothing.  No lotions, powders, or deodorants the day of the appointment. Make sure to bring picture ID and insurance card.  Bring list of medications you are currently taking including any supplements.   Schedule your [place] screening mammogram through MyChart!   Log into your MyChart account.  Go to 'Visit' (or 'Appointments' if on mobile App) --> Schedule an Appointment  Under 'Select a Reason for Visit' choose the Mammogram Screening option.  Complete the pre-visit questions and select the time and place that best fits your schedule.     This is a list of the screening recommended for you and due dates:  Health Maintenance  Topic Date Due   Hepatitis C Screening  Never done   DTaP/Tdap/Td vaccine (1 - Tdap) Never done   Zoster (Shingles) Vaccine (1 of 2) Never done   Colon Cancer Screening  Never done   COVID-19 Vaccine (3 - Pfizer risk series) 11/16/2019   Pap Smear  02/09/2022   Flu Shot  02/06/2023   Mammogram  05/25/2023   Medicare Annual Wellness Visit  04/22/2024   Pneumonia Vaccine  Completed   DEXA scan (bone density measurement)  Completed   HPV Vaccine  Aged Out    Advanced directives: (Declined) Advance directive discussed with you today. Even though you declined this today, please call our office should you change your mind, and we can  give you the proper paperwork for you to fill out.  Next Medicare Annual Wellness Visit scheduled for next year: Yes 04/27/24 @ 12:45

## 2023-07-08 ENCOUNTER — Other Ambulatory Visit: Payer: Self-pay | Admitting: Family

## 2023-07-08 DIAGNOSIS — F32A Depression, unspecified: Secondary | ICD-10-CM

## 2023-07-24 DIAGNOSIS — M5416 Radiculopathy, lumbar region: Secondary | ICD-10-CM | POA: Diagnosis not present

## 2023-07-30 ENCOUNTER — Encounter: Payer: Self-pay | Admitting: Family

## 2023-07-30 ENCOUNTER — Ambulatory Visit (INDEPENDENT_AMBULATORY_CARE_PROVIDER_SITE_OTHER): Payer: Medicare Other | Admitting: Family

## 2023-07-30 VITALS — BP 118/78 | HR 79 | Temp 97.8°F | Ht 66.0 in | Wt 170.0 lb

## 2023-07-30 DIAGNOSIS — F32A Depression, unspecified: Secondary | ICD-10-CM | POA: Diagnosis not present

## 2023-07-30 DIAGNOSIS — R635 Abnormal weight gain: Secondary | ICD-10-CM | POA: Diagnosis not present

## 2023-07-30 DIAGNOSIS — R1031 Right lower quadrant pain: Secondary | ICD-10-CM | POA: Diagnosis not present

## 2023-07-30 DIAGNOSIS — R9431 Abnormal electrocardiogram [ECG] [EKG]: Secondary | ICD-10-CM | POA: Diagnosis not present

## 2023-07-30 MED ORDER — OMEPRAZOLE 20 MG PO CPDR
20.0000 mg | DELAYED_RELEASE_CAPSULE | Freq: Every day | ORAL | 3 refills | Status: AC
Start: 2023-07-30 — End: ?

## 2023-07-30 MED ORDER — METFORMIN HCL ER 500 MG PO TB24
500.0000 mg | ORAL_TABLET | Freq: Every evening | ORAL | 2 refills | Status: DC
Start: 2023-07-30 — End: 2024-01-26

## 2023-07-30 MED ORDER — CITALOPRAM HYDROBROMIDE 20 MG PO TABS
20.0000 mg | ORAL_TABLET | Freq: Every day | ORAL | 3 refills | Status: DC
Start: 2023-07-30 — End: 2023-11-19

## 2023-07-30 MED ORDER — GABAPENTIN 100 MG PO CAPS
200.0000 mg | ORAL_CAPSULE | Freq: Every day | ORAL | 3 refills | Status: DC
Start: 2023-07-30 — End: 2024-01-26

## 2023-07-30 NOTE — Assessment & Plan Note (Signed)
Chronic, suboptimal control.  Aggravated by weight gain.  Advised to increase gabapentin to 200 mg nightly.  Advise she may take meloxicam 7.5 mg in the morning with food.  Advised to start omeprazole 40 mg daily to protect against gastritis. Referral to PT

## 2023-07-30 NOTE — Assessment & Plan Note (Signed)
Discussed low glycemic diet.  Start metformin and titrate.  Counseled on side effects

## 2023-07-30 NOTE — Assessment & Plan Note (Signed)
Grief after loss of husband.  Discussed grief [email or web address]   Increase Celexa to 20 mg daily.  Close follow-up

## 2023-07-30 NOTE — Assessment & Plan Note (Addendum)
Repeated EKG today as patient didn't see cardiology 2 years ago.  EKG with SR. nonspecific T wave inversion, flattening. No significant change since prior 06/15/21.  fortunately asymptomatic today.Pending echocardiogram and referral to cardiology for further risk stratification due to family history of ASCVD.

## 2023-07-30 NOTE — Progress Notes (Signed)
Assessment & Plan:  Abnormal EKG Assessment & Plan: Repeated EKG today as patient didn't see cardiology 2 years ago.  EKG with SR. nonspecific T wave inversion, flattening. No significant change since prior 06/15/21.  fortunately asymptomatic today.Pending echocardiogram and referral to cardiology for further risk stratification due to family history of ASCVD.   Orders: -     EKG 12-Lead -     ECHOCARDIOGRAM COMPLETE; Future -     Ambulatory referral to Cardiology  Right groin pain Assessment & Plan: Chronic, suboptimal control.  Aggravated by weight gain.  Advised to increase gabapentin to 200 mg nightly.  Advise she may take meloxicam 7.5 mg in the morning with food.  Advised to start omeprazole 40 mg daily to protect against gastritis. Referral to PT  Orders: -     Ambulatory referral to Physical Therapy -     Omeprazole; Take 1 capsule (20 mg total) by mouth daily. Take while on mobic ( NSAID)  Dispense: 90 capsule; Refill: 3 -     Gabapentin; Take 2 capsules (200 mg total) by mouth at bedtime.  Dispense: 180 capsule; Refill: 3  Depression, unspecified depression type Assessment & Plan: Grief after loss of husband.  Discussed grief [email or web address]   Increase Celexa to 20 mg daily.  Close follow-up  Orders: -     Citalopram Hydrobromide; Take 1 tablet (20 mg total) by mouth daily.  Dispense: 30 tablet; Refill: 3  Weight gain Assessment & Plan: Discussed low glycemic diet.  Start metformin and titrate.  Counseled on side effects  Orders: -     metFORMIN HCl ER; Take 1 tablet (500 mg total) by mouth every evening.  Dispense: 90 tablet; Refill: 2     Return precautions given.   Risks, benefits, and alternatives of the medications and treatment plan prescribed today were discussed, and patient expressed understanding.   Education regarding symptom management and diagnosis given to patient on AVS either electronically or printed.  Return in about 6 weeks (around  09/10/2023).  Rennie Plowman, FNP  Subjective:    Patient ID: Halina Andreas, female    DOB: 1956-04-29, 68 y.o.   MRN: [id number]  CC: HELYNE PESCHEL is a 68 y.o. female who presents today for follow up.   HPI: Husband passed away unexpectedly 5 months ago.  She has supportive family and friends however days when she cannot get out of bed. She worries about her children and their grief.   She is interested in increasing celexa. She is sleeping well.    Concerned with weight gain. Endorses snacking.   Right hip pain is worse with weight gain. She has been taking ibuprofen 400mg  most day.   She is doing stretching and home exercises. She would like referral to continue doing PT with [facility].     Compliant with gabapentin 100mg  at bedtime. She never took prednisone or tramadol as symptoms improved.    Mammogram scheduled  Colonoscopy is due    Consult [facility] 10/29/2022 for suspected osteoarthritis of right hip.  Treated conservatively and referral to PT  Denies SOB, CP.   H/o Prolapsed mitral valve during pregnancy Allergies: Codeine Current Outpatient Medications on File Prior to Visit  Medication Sig Dispense Refill   ALPRAZolam (XANAX) 0.25 MG tablet Take 1 tablet (0.25 mg total) by mouth daily as needed for anxiety. (Patient not taking: Reported on 07/30/2023) 30 tablet 0   benzonatate (TESSALON) 100 MG capsule Take 1 capsule (100 mg total) by mouth every 8 (  eight) hours. 21 capsule 0   cholecalciferol (VITAMIN D) 1000 UNITS tablet Take 1,000 Units by mouth daily. (Patient not taking: Reported on 07/30/2023)     doxycycline (VIBRAMYCIN) 100 MG capsule Take 1 capsule (100 mg total) by mouth 2 (two) times daily. 20 capsule 0   meloxicam (MOBIC) 7.5 MG tablet Take 1 tablet (7.5 mg total) by mouth daily as needed for pain. 30 tablet 2   No current facility-administered medications on file prior to visit.    Review of Systems  Constitutional:  Negative for chills and  fever.  Respiratory:  Negative for cough.   Cardiovascular:  Negative for chest pain and palpitations.  Gastrointestinal:  Negative for nausea and vomiting.  Musculoskeletal:  Positive for arthralgias (right hip).  Psychiatric/Behavioral:  Negative for sleep disturbance.       Objective:    BP 118/78   Pulse 79   Temp 97.8 F (36.6 C) (Oral)   Ht 5\' 6"  (1.676 m)   Wt 170 lb (77.1 kg)   SpO2 98%   BMI 27.44 kg/m  BP Readings from Last 3 Encounters:  07/30/23 118/78  04/22/23 121/79  09/27/22 112/78   Wt Readings from Last 3 Encounters:  07/30/23 170 lb (77.1 kg)  04/23/23 155 lb (70.3 kg)  09/27/22 160 lb 1.6 oz (72.6 kg)      07/30/2023   11:26 AM 04/23/2023    3:06 PM 08/06/2022    1:39 PM  Depression screen PHQ 2/9  Decreased Interest 1 0 0  Down, Depressed, Hopeless 1 1 0  PHQ - 2 Score 2 1 0  Altered sleeping 1 2   Tired, decreased energy 1 2   Change in appetite 1 0   Feeling bad or failure about yourself  1 0   Trouble concentrating 1 1   Moving slowly or fidgety/restless 0 0   Suicidal thoughts 0 0   PHQ-9 Score 7 6   Difficult doing work/chores Not difficult at all Somewhat difficult      Physical Exam Vitals reviewed.  Constitutional:      Appearance: She is well-developed.  Eyes:     Conjunctiva/sclera: Conjunctivae normal.  Cardiovascular:     Rate and Rhythm: Normal rate and regular rhythm.     Pulses: Normal pulses.     Heart sounds: Normal heart sounds.  Pulmonary:     Effort: Pulmonary effort is normal.     Breath sounds: Normal breath sounds. No wheezing, rhonchi or rales.  Skin:    General: Skin is warm and dry.  Neurological:     Mental Status: She is alert.  Psychiatric:        Speech: Speech normal.        Behavior: Behavior normal.        Thought Content: Thought content normal.

## 2023-07-30 NOTE — Patient Instructions (Addendum)
Referral for [facility] physical therapy, echocardiogram and also to cardiology.    Let me know and I can place referral for colonoscopy   Let us know if you dont hear back within a week in regards to an appointments being scheduled.    Consider [email or web address].gl  Increase Celexa to 20mg .   Increase gabapentin to 200 mg taken at bedtime.  You may take meloxicam (NSAID) during the day however if you use this for prolonged periods of time, I have sent in omeprazole to protect your stomach  Metformin is used in prediabetes, diabetes, and also for weight loss by decreasing calorie consumption.   It works in a couple of ways by decreasing liver glucose production, decreases intestinal absorption of glucose and improves insulin sensitivity (increases peripheral glucose uptake and utilization).     Please make sure that you titrate per below so not to cause any GI upset.    Start metformin XR with one 500mg  tablet at night. After one week, you may increase to two tablets at night ( total of 1000mg ) . The third week, you may take take two tablets at night and one tablet in the morning.  The fourth week, you may take two tablets in the morning ( 1000mg  total) and two tablets at night (1000mg  total). This will bring you to a maximum daily dose of 2000mg /day which is maximum dose.  So you are aware,  you may take ALL 4 tablets of metformin together at the same time if preferable and doesn't cause GI upset. You may take metformin 2000mg  ( four of the 500mg  tablets) together in the morning or at night if you prefer.   Along the way, if you want to increase more slowly, please do as this medication can cause GI discomfort and loose stools which usually get better with time , however some patients find that they can only tolerate a certain dose and cannot increase to maximum dose.     Please download Myfitness Pal App ( basic version is free).   You may log every thing you eat for even 2-3  days to get a better of idea of total daily calories. To loose weight, we have to create caloric deficit to loose weight. The goal is 1-2 lbs per week of weight loss.   Excellent article below from [facility].   https://www.health.CriticalZ.it  Calorie counting made easy  Eat less, exercise more. If only it were that simple! As most dieters know, losing weight can be very challenging. As this report details, a range of influences can affect how people gain and lose weight. But a basic understanding of how to tip your energy balance in favor of weight loss is a good place to start.  Start by determining how many calories you should consume each day. To do so, you need to know how many calories you need to maintain your current weight. Doing this requires a few simple calculations.  First, multiply your current weight by 15 -- that's roughly the number of calories per pound of body weight needed to maintain your current weight if you are moderately active. Moderately active means getting at least 30 minutes of physical activity a day in the form of exercise (walking at a brisk pace, climbing stairs, or active gardening). Let's say you're a woman who is 5 feet, 4 inches tall and weighs 155 pounds, and you need to lose about 15 pounds to put you in a healthy weight range. If you multiply 155 by  15, you will get 2,325, which is the number of calories per day that you need in order to maintain your current weight (weight-maintenance calories). To lose weight, you will need to get below that total.  For example, to lose 1 to 2 pounds a week -- a rate that experts consider safe -- your food consumption should provide 500 to 1,000 calories less than your total weight-maintenance calories. If you need 2,325 calories a day to maintain your current weight, reduce your daily calories to between 1,325 and 1,825. If you are sedentary, you will also need to build more  activity into your day. In order to lose at least a pound a week, try to do at least 30 minutes of physical activity on most days, and reduce your daily calorie intake by at least 500 calories. However, calorie intake should not fall below 1,200 a day in women or 1,500 a day in men, except under the supervision of a health professional. Eating too few calories can endanger your health by depriving you of needed nutrients.  Meeting your calorie target How can you meet your daily calorie target? One approach is to add up the number of calories per serving of all the foods that you eat, and then plan your menus accordingly. You can buy books that list calories per serving for many foods. In addition, the nutrition labels on all packaged foods and beverages provide calories per serving information. Make a point of reading the labels of the foods and drinks you use, noting the number of calories and the serving sizes. Many recipes published in cookbooks, newspapers, and magazines provide similar information.  If you hate counting calories, a different approach is to restrict how much and how often you eat, and to eat meals that are low in calories. Dietary guidelines issued by the American Heart Association stress common sense in choosing your foods rather than focusing strictly on numbers, such as total calories or calories from fat. Whichever method you choose, research shows that a regular eating schedule -- with meals and snacks planned for certain times each day -- makes for the most successful approach. The same applies after you have lost weight and want to keep it off. Sticking with an eating schedule increases your chance of maintaining your new weight.    This is  Dr. Melina Schools  ( an amazing physician in my office!)  example of a  "Low GI"  Diet:  It will allow you to lose 4 to 8  lbs  per month if you follow it carefully.  Your goal with exercise is a minimum of 30 minutes of aerobic exercise 5 days per  week (Walking does not count once it becomes easy!)    All of the foods can be found at grocery stores and in bulk at [employer].  The Atkins protein bars and shakes are available in more varieties at Target, WalMart and [employer]'s Foods.     7 AM Breakfast:  Choose from the following:  Low carbohydrate Protein  Shakes (I recommend the  Premier Protein chocolate shakes,  EAS AdvantEdge "Carb Control" shakes  Or the Atkins shakes all are under 3 net carbs)     a scrambled egg/bacon/cheese burrito made with Mission's "carb balance" whole wheat tortilla  (about 10 net carbs )  Medical laboratory scientific officer (basically a quiche without the pastry crust) that is eaten cold and very convenient way to get your eggs.  8 carbs)  If you  make your own protein shakes, avoid bananas and pineapple,  And use low carb greek yogurt or original /unsweetened almond or soy milk    Avoid cereal and bananas, oatmeal and cream of wheat and grits. They are loaded with carbohydrates!   10 AM: high protein snack:  Protein bar by Atkins (the snack size, under 200 cal, usually < 6 net carbs).    A stick of cheese:  Around 1 carb,  100 cal     Dannon Light n Fit [place] Yogurt  (80 cal, 8 carbs)  Other so called "protein bars" and Greek yogurts tend to be loaded with carbohydrates.  Remember, in food advertising, the word "energy" is synonymous for " carbohydrate."  Lunch:   A Sandwich using the bread choices listed, Can use any  Eggs,  lunchmeat, grilled meat or canned tuna), avocado, regular mayo/mustard  and cheese.  A Salad using blue cheese, ranch,  Goddess or vinagrette,  Avoid taco shells, croutons or "confetti" and no "candied nuts" but regular nuts OK.   No pretzels, nabs  or chips.  Pickles and miniature sweet peppers are a good low carb alternative that provide a "crunch"  The bread is the only source of carbohydrate in a sandwich and  can be decreased by trying some of the attached alternatives to  traditional loaf bread   Avoid "Low fat dressings, as well as Reyne Dumas and [employer] dressings They are loaded with sugar!   3 PM/ Mid day  Snack:  Consider  1 ounce of  almonds, walnuts, pistachios, pecans, peanuts,  Macadamia nuts or a nut medley.  Avoid "granola and granola bars "  Mixed nuts are ok in moderation as long as there are no raisins,  cranberries or dried fruit.   KIND bars are OK if you get the low glycemic index variety   Try the prosciutto/mozzarella cheese sticks by Fiorruci  In deli /backery section   High protein      6 PM  Dinner:     Meat/fowl/fish with a green salad, and either broccoli, cauliflower, green beans, spinach, brussel sprouts or  Lima beans. DO NOT BREAD THE PROTEIN!!      There is a low carb pasta by Dreamfield's that is acceptable and tastes great: only 5 digestible carbs/serving.( All grocery stores but BJs carry it ) Several ready made meals are available low carb:   Try Michel Angelo's chicken piccata or chicken or eggplant parm over low carb pasta.(Lowes and BJs)   Clifton Custard Sanchez's "Carnitas" (pulled pork, no sauce,  0 carbs) or his beef pot roast to make a dinner burrito (at BJ's)  Pesto over low carb pasta (bj's sells a good quality pesto in the center refrigerated section of the deli   Try satueeing  Roosvelt Harps with mushroooms as a good side   [employer] Giant makes a mashed cauliflower that tastes like mashed potatoes  Whole wheat pasta is still full of digestible carbs and  Not as low in glycemic index as Dreamfield's.   Brown rice is still rice,  So skip the rice and noodles if you eat [place] or [place] (or at least limit to 1/2 cup)  9 PM snack :   Breyer's "low carb" fudgsicle or  ice cream bar (Carb Smart line), or  Weight Watcher's ice cream bar , or another "no sugar added" ice cream;  a serving of fresh berries/cherries with whipped cream   Cheese or DANNON'S LlGHT N FIT GREEK YOGURT  8 ounces  of [place] Diamond unsweetened almond/cococunut  milk    Treat yourself to a parfait made with whipped cream blueberiies, walnuts and vanilla greek yogurt  Avoid bananas, pineapple, grapes  and watermelon on a regular basis because they are high in sugar.  THINK OF THEM AS DESSERT  Remember that snack Substitutions should be less than 10 NET carbs per serving and meals < 20 carbs. Remember to subtract fiber grams to get the "net carbs."

## 2023-08-07 DIAGNOSIS — M5416 Radiculopathy, lumbar region: Secondary | ICD-10-CM | POA: Diagnosis not present

## 2023-08-14 ENCOUNTER — Ambulatory Visit: Payer: Medicare Other | Attending: Family

## 2023-08-14 ENCOUNTER — Encounter: Payer: Self-pay | Admitting: Family

## 2023-08-14 DIAGNOSIS — R9431 Abnormal electrocardiogram [ECG] [EKG]: Secondary | ICD-10-CM | POA: Diagnosis not present

## 2023-08-14 LAB — ECHOCARDIOGRAM COMPLETE
AR max vel: 3.19 cm2
AV Area VTI: 3.14 cm2
AV Area mean vel: 3.05 cm2
AV Mean grad: 3 mm[Hg]
AV Peak grad: 5.9 mm[Hg]
Ao pk vel: 1.21 m/s
Area-P 1/2: 3.65 cm2
Calc EF: 55.2 %
S' Lateral: 3.7 cm
Single Plane A2C EF: 53.4 %
Single Plane A4C EF: 57.2 %

## 2023-08-20 ENCOUNTER — Other Ambulatory Visit: Payer: Self-pay | Admitting: Family

## 2023-08-20 DIAGNOSIS — R1031 Right lower quadrant pain: Secondary | ICD-10-CM

## 2023-08-20 DIAGNOSIS — M5442 Lumbago with sciatica, left side: Secondary | ICD-10-CM

## 2023-08-20 MED ORDER — MELOXICAM 7.5 MG PO TABS
7.5000 mg | ORAL_TABLET | Freq: Every day | ORAL | 1 refills | Status: DC | PRN
Start: 2023-08-20 — End: 2023-11-19

## 2023-09-04 DIAGNOSIS — M5416 Radiculopathy, lumbar region: Secondary | ICD-10-CM | POA: Diagnosis not present

## 2023-09-08 ENCOUNTER — Encounter: Payer: Self-pay | Admitting: Family

## 2023-09-09 ENCOUNTER — Ambulatory Visit
Admission: RE | Admit: 2023-09-09 | Discharge: 2023-09-09 | Disposition: A | Discharge status: Home / Self Care | Payer: Medicare Other | Source: Ambulatory Visit | Attending: Family | Admitting: Family

## 2023-09-09 ENCOUNTER — Other Ambulatory Visit: Payer: Self-pay

## 2023-09-09 ENCOUNTER — Emergency Department
Admission: EM | Admit: 2023-09-09 | Discharge: 2023-09-09 | Disposition: A | Discharge status: Home / Self Care | Attending: Emergency Medicine | Admitting: Emergency Medicine

## 2023-09-09 DIAGNOSIS — Z780 Asymptomatic menopausal state: Secondary | ICD-10-CM | POA: Diagnosis not present

## 2023-09-09 DIAGNOSIS — R112 Nausea with vomiting, unspecified: Secondary | ICD-10-CM | POA: Diagnosis not present

## 2023-09-09 DIAGNOSIS — Z1231 Encounter for screening mammogram for malignant neoplasm of breast: Secondary | ICD-10-CM | POA: Insufficient documentation

## 2023-09-09 DIAGNOSIS — Z79899 Other long term (current) drug therapy: Secondary | ICD-10-CM | POA: Diagnosis not present

## 2023-09-09 DIAGNOSIS — E876 Hypokalemia: Secondary | ICD-10-CM | POA: Diagnosis not present

## 2023-09-09 DIAGNOSIS — R55 Syncope and collapse: Secondary | ICD-10-CM | POA: Diagnosis not present

## 2023-09-09 DIAGNOSIS — R531 Weakness: Secondary | ICD-10-CM | POA: Diagnosis not present

## 2023-09-09 LAB — CBC
HCT: 36.8 % (ref 36.0–46.0)
Hemoglobin: 12.4 g/dL (ref 12.0–15.0)
MCH: 30 pg (ref 26.0–34.0)
MCHC: 33.7 g/dL (ref 30.0–36.0)
MCV: 88.9 fL (ref 80.0–100.0)
Platelets: 327 10*3/uL (ref 150–400)
RBC: 4.14 MIL/uL (ref 3.87–5.11)
RDW: 13.1 % (ref 11.5–15.5)
WBC: 10.3 10*3/uL (ref 4.0–10.5)
nRBC: 0 % (ref 0.0–0.2)

## 2023-09-09 LAB — BASIC METABOLIC PANEL
Anion gap: 15 (ref 5–15)
BUN: 15 mg/dL (ref 8–23)
CO2: 17 mmol/L — ABNORMAL LOW (ref 22–32)
Calcium: 9.4 mg/dL (ref 8.9–10.3)
Chloride: 105 mmol/L (ref 98–111)
Creatinine, Ser: 0.69 mg/dL (ref 0.44–1.00)
GFR, Estimated: >60 mL/min (ref >60)
Glucose, Bld: 134 mg/dL — ABNORMAL HIGH (ref 70–99)
Potassium: 2.8 mmol/L — ABNORMAL LOW (ref 3.5–5.1)
Sodium: 137 mmol/L (ref 135–145)

## 2023-09-09 LAB — TROPONIN I (HIGH SENSITIVITY): Troponin I (High Sensitivity): <2 ng/L (ref <18)

## 2023-09-09 LAB — RESP PANEL BY RT-PCR (RSV, FLU A&B, COVID)  RVPGX2
Influenza A by PCR: NEGATIVE
Influenza B by PCR: NEGATIVE
Resp Syncytial Virus by PCR: NEGATIVE
SARS Coronavirus 2 by RT PCR: NEGATIVE

## 2023-09-09 MED ORDER — POTASSIUM CHLORIDE CRYS ER 20 MEQ PO TBCR
40.0000 meq | EXTENDED_RELEASE_TABLET | Freq: Once | ORAL | Status: AC
  Administered 2023-09-09: 40 meq via ORAL
  Filled 2023-09-09: qty 2

## 2023-09-09 MED ORDER — SODIUM CHLORIDE 0.9 % IV SOLN
12.5000 mg | Freq: Once | INTRAVENOUS | Status: AC
  Administered 2023-09-09: 12.5 mg via INTRAVENOUS
  Filled 2023-09-09: qty 12.5

## 2023-09-09 MED ORDER — POTASSIUM CHLORIDE CRYS ER 20 MEQ PO TBCR
20.0000 meq | EXTENDED_RELEASE_TABLET | Freq: Two times a day (BID) | ORAL | 0 refills | Status: DC
Start: 2023-09-09 — End: 2023-10-30

## 2023-09-09 MED ORDER — SODIUM CHLORIDE 0.9 % IV BOLUS
1000.0000 mL | Freq: Once | INTRAVENOUS | Status: AC
  Administered 2023-09-09: 1000 mL via INTRAVENOUS

## 2023-09-09 MED ORDER — ONDANSETRON HCL 4 MG/2ML IJ SOLN
4.0000 mg | Freq: Once | INTRAMUSCULAR | Status: AC
  Administered 2023-09-09: 4 mg via INTRAVENOUS
  Filled 2023-09-09: qty 2

## 2023-09-09 MED ORDER — ONDANSETRON HCL 4 MG PO TABS: 4.0000 mg | ORAL_TABLET | Freq: Three times a day (TID) | ORAL | 0 refills | Status: AC | PRN

## 2023-09-09 NOTE — ED Notes (Signed)
 Patient tolerated po challenge, agreeable to discharge. MD Vicente Males made aware.

## 2023-09-09 NOTE — ED Notes (Signed)
Awaiting Phenergan from pharmacy 

## 2023-09-09 NOTE — ED Provider Notes (Signed)
 Emergency department handoff note  Care of this patient was signed out to me at the end of the previous provider shift.  All pertinent patient information was conveyed and all questions were answered.  Patient pending p.o. tolerance after treatment.  Patient is p.o. tolerant at this time The patient has been reexamined and is ready to be discharged.  All diagnostic results have been reviewed and discussed with the patient/family.  Care plan has been outlined and the patient/family understands all current diagnoses, results, and treatment plans.  There are no new complaints, changes, or physical findings at this time.  All questions have been addressed and answered.  Patient was instructed to, and agrees to follow-up with their primary care physician as well as return to the emergency department if any new or worsening symptoms develop.   Merwyn Katos, MD 09/09/23 [phone]

## 2023-09-09 NOTE — Discharge Instructions (Addendum)
 Your potassium was slightly low today so I have sent repletion to your pharmacy for you to take for the next 5 days.  Please follow-up with your primary care provider as needed.  Please see them within the next 1 to 2 weeks for ongoing evaluation as they may suggest he wear something like a Holter monitor.

## 2023-09-09 NOTE — ED Provider Notes (Signed)
 [facility] Provider Note    Event Date/Time   First MD Initiated Contact with Patient 09/09/23 1343     (approximate)   History   Loss of Consciousness   HPI Jill Ward is a 68 y.o. female presenting today for syncope.  Patient states she was at church when all of a sudden she started feeling hot and lightheaded.  She then reportedly had a syncopal episode that was witnessed.  No head injury.  Has had some nausea since then but no vomiting.  She otherwise denies chest pain, palpitations, shortness of breath.  No recent syncopal episodes.  She did states she has been dealing with sciatica back pain and her regular meds were treating it so her provider just started her on tramadol and she took the first dose this morning shortly before it happened.  Otherwise tolerating p.o. and no dehydration recently that she is aware of.     Physical Exam   Triage Vital Signs: ED Triage Vitals  Encounter Vitals Group     BP 09/09/23 1356 111/70     Systolic BP Percentile --      Diastolic BP Percentile --      Pulse Rate 09/09/23 1348 77     Resp 09/09/23 1348 20     Temp 09/09/23 1348 97.7 F (36.5 C)     Temp Source 09/09/23 1348 Oral     SpO2 09/09/23 1348 100 %     Weight 09/09/23 1348 160 lb (72.6 kg)     Height 09/09/23 1348 5\' 5"  (1.651 m)     Head Circumference --      Peak Flow --      Pain Score 09/09/23 1348 0     Pain Loc --      Pain Education --      Exclude from Growth Chart --     Most recent vital signs: Vitals:   09/09/23 1356 09/09/23 1400  BP: 111/70 113/71  Pulse:  70  Resp:  20  Temp:    SpO2:  100%   Physical Exam: I have reviewed the vital signs and nursing notes. General: Awake, alert, no acute distress.  Nontoxic appearing. Head:  Atraumatic, normocephalic.   ENT:  EOM intact, PERRL. Oral mucosa is pink and moist with no lesions. Neck: Neck is supple with full range of motion, No meningeal signs. Cardiovascular:  RRR,  No murmurs. Peripheral pulses palpable and equal bilaterally. Respiratory:  Symmetrical chest wall expansion.  No rhonchi, rales, or wheezes.  Good air movement throughout.  No use of accessory muscles.   Musculoskeletal:  No cyanosis or edema. Moving extremities with full ROM Abdomen:  Soft, nontender, nondistended. Neuro:  GCS 15, moving all four extremities, interacting appropriately. Speech clear. Psych:  Calm, appropriate.   Skin:  Warm, dry, no rash.    ED Results / Procedures / Treatments   Labs (all labs ordered are listed, but only abnormal results are displayed) Labs Reviewed  BASIC METABOLIC PANEL - Abnormal; Notable for the following components:      Result Value   Potassium 2.8 (*)    CO2 17 (*)    Glucose, Bld 134 (*)    All other components within normal limits  RESP PANEL BY RT-PCR (RSV, FLU A&B, COVID)  RVPGX2  CBC  CBG MONITORING, ED  TROPONIN I (HIGH SENSITIVITY)     EKG My EKG interpretation: Rate of 72, normal sinus rhythm, normal axis, normal intervals.  No  acute ST elevations or depressions   RADIOLOGY    PROCEDURES:  Critical Care performed: No  Procedures   MEDICATIONS ORDERED IN ED: Medications  potassium chloride SA (KLOR-CON M) CR tablet 40 mEq (has no administration in time range)  promethazine (PHENERGAN) 12.5 mg in sodium chloride 0.9 % 50 mL IVPB (has no administration in time range)  sodium chloride 0.9 % bolus 1,000 mL (1,000 mLs Intravenous New Bag/Given 09/09/23 1403)  ondansetron (ZOFRAN) injection 4 mg (4 mg Intravenous Given 09/09/23 1418)     IMPRESSION / MDM / ASSESSMENT AND PLAN / ED COURSE  I reviewed the triage vital signs and the nursing notes.                              Differential diagnosis includes, but is not limited to, vasovagal syncope, dehydration, electrolyte abnormality, cardiac arrhythmia  Patient's presentation is most consistent with acute complicated illness / injury requiring diagnostic  workup.  Patient is a 68 year old female presenting today for episode of syncope with preceding symptoms of hot feeling along with lightheadedness.  Mild nausea afterwards.  On arrival she is rather asymptomatic aside from slight nausea.  Vital signs are stable.  EKG unremarkable.  Laboratory workup most notable for hypokalemia at 2.8.  Negative for COVID, flu, and RSV.  Troponin negative.  Patient given oral potassium repletion.  Has been able to ambulate but on reassessment had repeat nausea with vomiting.  Will give additional dose of nausea medication with Phenergan and reassess before likely discharge.  I do suspect this is a vasovagal episode and may have accompanied viral symptoms contributing to that.  Patient signed out to oncoming provider pending reassessment.  The patient is on the cardiac monitor to evaluate for evidence of arrhythmia and/or significant heart rate changes.     FINAL CLINICAL IMPRESSION(S) / ED DIAGNOSES   Final diagnoses:  Syncope and collapse  Hypokalemia     Rx / DC Orders   ED Discharge Orders     None        Note:  This document was prepared using Dragon voice recognition software and may include unintentional dictation errors.   Janith Lima, MD 09/09/23 [phone]

## 2023-09-09 NOTE — ED Triage Notes (Signed)
 EMS states patient took 1 Tramadol at 1000 for sciatica; had syncopal episode in church pew and family called EMS. En route patient had episode of vomiting.

## 2023-09-10 ENCOUNTER — Encounter: Payer: Self-pay | Admitting: Family

## 2023-09-11 ENCOUNTER — Telehealth: Payer: Self-pay | Admitting: Family

## 2023-09-11 ENCOUNTER — Ambulatory Visit: Payer: Medicare Other | Admitting: Family

## 2023-09-11 DIAGNOSIS — M5442 Lumbago with sciatica, left side: Secondary | ICD-10-CM

## 2023-09-11 DIAGNOSIS — R1031 Right lower quadrant pain: Secondary | ICD-10-CM

## 2023-09-11 NOTE — Telephone Encounter (Signed)
 Lft pt vm to call ofc to sch MRI. thanks

## 2023-09-11 NOTE — Telephone Encounter (Signed)
  Referral team How soon can we sch MRI lumbar and arrange consult with [facility]? Let me know when scheduled       My notes   Called patient on cell phone this morning as we had an emergency gas leak and evacuated to parking lot. Concern for her appointment time at 9:30 am   Discussed syncopal episode, LOC.   She is feeling much better today. Denies F, N, diarrhea, dizziness She is eating, drinking.   She describes syncopal episode 2 days ago in which she was standing at work. She had vomiting and diarrhea that morning.  She has not eaten much for breakfast due to nausea.  She took one dose of tramadol that morning due to hip pain.  She had also taken tramadol 50 mg at night prior.  Suddenly she broke out in a hot sweat and felt clammy and lightheaded. No associated CP, SOB, palpations, HA, vision loss. She felt her sugar was low.  She felt like she was in a pass out so she sat down which was the last thing she recalls.    She was found on a bench by colleague. No head injury.  She has passed out 1 other time in her life when (send emergency department on a hot summer day.  She felt she was dehydrated and did not have enough to eat that day.  Since emergency room visit in regards to hypokalemia.  Troponin negative.  Improved with Phenergan in ED. provided Zofran for discharge  She feels well today does not feel the need to reschedule her appointment today or tomorrow.  She is traveling to [place] next month and is most concerned with her right low back and right leg pain.  The numbness is radiating down to her right ankle. No trouble urinating.   Known Arthritic changes bilateral hips.  Lumbar X-ray with mild to space loss L2-L3.   Plan Patient declines ZIO monitor at this time to evaluate for underlying arrhythmia.  She is not experiencing palpitations or dizziness except for this episode.  She will monitor closely let me know if symptom recurs.  She will keep upcoming  cardiology follow-up and make Dr Sandie Ano aware of syncopal episodes We discussed MRI lumbar spine in setting of radiculopathy Continue meloxicam gabapentin. Referral to orthopedics Advised to hold on PT as not helping pain.   She will let me know how she is doing via MyChart.  We discussed prednisone however opted to hold on prednisone taper awaiting orthopedic consult

## 2023-09-12 ENCOUNTER — Telehealth: Payer: Self-pay | Admitting: Family

## 2023-09-12 DIAGNOSIS — M1611 Unilateral primary osteoarthritis, right hip: Secondary | ICD-10-CM | POA: Diagnosis not present

## 2023-09-12 DIAGNOSIS — M5416 Radiculopathy, lumbar region: Secondary | ICD-10-CM | POA: Diagnosis not present

## 2023-09-12 NOTE — Telephone Encounter (Signed)
 Lft pt vm to call ofc to sch MRI. thanks

## 2023-09-15 ENCOUNTER — Telehealth: Payer: Self-pay | Admitting: Family

## 2023-09-15 NOTE — Telephone Encounter (Signed)
 Lft pt vm to call ofc to sch MRI. thanks

## 2023-10-15 DIAGNOSIS — M1611 Unilateral primary osteoarthritis, right hip: Secondary | ICD-10-CM | POA: Diagnosis not present

## 2023-10-30 ENCOUNTER — Ambulatory Visit: Payer: Medicare Other | Attending: Cardiology | Admitting: Cardiology

## 2023-10-30 ENCOUNTER — Encounter: Payer: Self-pay | Admitting: Cardiology

## 2023-10-30 VITALS — BP 104/64 | HR 74 | Ht 65.5 in | Wt 163.4 lb

## 2023-10-30 DIAGNOSIS — I361 Nonrheumatic tricuspid (valve) insufficiency: Secondary | ICD-10-CM

## 2023-10-30 DIAGNOSIS — R9431 Abnormal electrocardiogram [ECG] [EKG]: Secondary | ICD-10-CM | POA: Diagnosis not present

## 2023-10-30 NOTE — Progress Notes (Signed)
 Cardiology Office Note:    Date:  10/30/2023   ID:  Jill Ward, DOB [birth year], MRN [id number]  PCP:  Calista Catching, FNP   [facility] Providers Cardiologist:  Constancia Delton, MD     Referring MD: Calista Catching, FNP   Chief Complaint  Patient presents with   New Patient (Initial Visit)    Ref by Bascom Bossier, FNP for an abnormal EKG & discuss Echo results.      History of Present Illness:    Jill Ward is a 68 y.o. female with a hx of right hip arthritis, sciatica who presents with abnormal EKG.  Patient saw primary care provider for scheduled visit.  EKG obtained showed septal T wave inversions.  She denies chest pain or shortness of breath.  Denies any personal or family history of heart disease or heart attacks.  Her mobility is limited by right hip pain and sciatica.  Follows up with orthopedics for this.  Echo obtained 2/25 showed normal systolic function EF 60 to 65%, mild to moderate TR.  Past Medical History:  Diagnosis Date   Allergy    Seasonal   Irritable bowel syndrome (IBS)    Meniere's disease    Migraines    Prolapse of mitral valve    during pregnancy    Past Surgical History:  Procedure Laterality Date   BREAST CYST ASPIRATION     BREAST EXCISIONAL BIOPSY Left 2014   papilloma   BREAST SURGERY     CHOLECYSTECTOMY  1999   COLONOSCOPY  2004   Dr Marquita Situ   Sqamous Cancer Cell  2016   On chest    Current Medications: Current Meds  Medication Sig   cholecalciferol (VITAMIN D ) 1000 UNITS tablet Take 1,000 Units by mouth daily.   citalopram  (CELEXA ) 20 MG tablet Take 1 tablet (20 mg total) by mouth daily.   gabapentin  (NEURONTIN ) 100 MG capsule Take 2 capsules (200 mg total) by mouth at bedtime.   meloxicam  (MOBIC ) 7.5 MG tablet Take 1-2 tablets (7.5-15 mg total) by mouth daily as needed for pain.   omeprazole  (PRILOSEC) 20 MG capsule Take 1 capsule (20 mg total) by mouth daily. Take while on mobic  ( NSAID)      Allergies:   Codeine and Tramadol    Social History   Socioeconomic History   Marital status: Married    Spouse name: Not on file   Number of children: Not on file   Years of education: Not on file   Highest education level: Associate degree: academic program  Occupational History   Not on file  Tobacco Use   Smoking status: Never   Smokeless tobacco: Never  Vaping Use   Vaping status: Never Used  Substance and Sexual Activity   Alcohol use: No    Alcohol/week: 0.0 standard drinks of alcohol   Drug use: No   Sexual activity: Never  Other Topics Concern   Not on file  Social History Narrative   Retired Administrator    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, has recently closed down   Lives with husband and watches her grandchildren   4 children   Pets: None   Caffeine- 3 cans of diet coke       Husband passed away Mar 21, 2023   Social Drivers of Health   Financial Resource Strain: Low Risk  (07/29/2023)   Overall Financial Resource Strain (CARDIA)    Difficulty of Paying Living Expenses: Not very hard  Food Insecurity: No Food  Insecurity (07/29/2023)   Hunger Vital Sign    Worried About Running Out of Food in the Last Year: Never true    Ran Out of Food in the Last Year: Never true  Transportation Needs: No Transportation Needs (07/29/2023)   PRAPARE - Administrator, Civil Service (Medical): No    Lack of Transportation (Non-Medical): No  Physical Activity: Unknown (07/29/2023)   Exercise Vital Sign    Days of Exercise per Week: Patient declined    Minutes of Exercise per Session: 0 min  Stress: Stress Concern Present (07/29/2023)   [employer] of Occupational Health - Occupational Stress Questionnaire    Feeling of Stress : To some extent  Social Connections: Moderately Integrated (07/29/2023)   Social Connection and Isolation Panel [NHANES]    Frequency of Communication with Friends and Family: More than three times a week    Frequency of Social  Gatherings with Friends and Family: More than three times a week    Attends Religious Services: More than 4 times per year    Active Member of [employer] or Organizations: Yes    Attends Banker Meetings: More than 4 times per year    Marital Status: Widowed     Family History: The patient's family history includes Breast cancer (age of onset: 73) in her maternal aunt; Cancer (age of onset: 7) in her mother; Crohn's disease in her mother; Dementia in her father; Ovarian cancer (age of onset: 42) in her maternal aunt; Ovarian cancer (age of onset: 73) in her paternal grandmother; Pneumonia in her father.  ROS:   Please see the history of present illness.     All other systems reviewed and are negative.  EKGs/Labs/Other Studies Reviewed:    The following studies were reviewed today:  EKG Interpretation Date/Time:  Thursday October 30 2023 09:25:44 EDT Ventricular Rate:  74 PR Interval:  158 QRS Duration:  70 QT Interval:  376 QTC Calculation: 417 R Axis:   23  Text Interpretation: Normal sinus rhythm Nonspecific ST abnormality Confirmed by Constancia Delton ([id number]) on 10/30/2023 9:39:10 AM    Recent Labs: 09/09/2023: BUN 15; Creatinine, Ser 0.69; Hemoglobin 12.4; Platelets 327; Potassium 2.8; Sodium 137  Recent Lipid Panel    Component Value Date/Time   CHOL 239 (H) 02/09/2021 1158   TRIG 112.0 02/09/2021 1158   HDL 72.30 02/09/2021 1158   CHOLHDL 3 02/09/2021 1158   VLDL 22.4 02/09/2021 1158   LDLCALC 144 (H) 02/09/2021 1158     Risk Assessment/Calculations:               Physical Exam:    VS:  BP 104/64 (BP Location: Right Arm, Patient Position: Sitting, Cuff Size: Normal)   Pulse 74   Ht 5' 5.5" (1.664 m)   Wt 163 lb 6 oz (74.1 kg)   SpO2 98%   BMI 26.77 kg/m     Wt Readings from Last 3 Encounters:  10/30/23 163 lb 6 oz (74.1 kg)  09/09/23 160 lb (72.6 kg)  07/30/23 170 lb (77.1 kg)     GEN:  Well nourished, well developed in no acute  distress HEENT: Normal NECK: No JVD; No carotid bruits CARDIAC: RRR, no murmurs, rubs, gallops RESPIRATORY:  Clear to auscultation without rales, wheezing or rhonchi  ABDOMEN: Soft, non-tender, non-distended MUSCULOSKELETAL:  No edema; No deformity  SKIN: Warm and dry NEUROLOGIC:  Alert and oriented x 3 PSYCHIATRIC:  Normal affect   ASSESSMENT:    1. Abnormal EKG  2. Nonrheumatic tricuspid valve regurgitation    PLAN:    In order of problems listed above:  Abnormal ECG, nonspecific septal T wave inversions.  No changes from prior EKG 2 to 3 years ago.  Patient asymptomatic.  Echo with normal EF.  No indication for ischemic workup at this time. Mild to moderate tricuspid regurgitation.  Monitor serially with echoes.  Will plan repeat echo in 1 year.  Follow-up in 1 year after repeat echo.       Medication Adjustments/Labs and Tests Ordered: Current medicines are reviewed at length with the patient today.  Concerns regarding medicines are outlined above.  Orders Placed This Encounter  Procedures   EKG 12-Lead   ECHOCARDIOGRAM COMPLETE   No orders of the defined types were placed in this encounter.   Patient Instructions  Medication Instructions:  Your physician recommends that you continue on your current medications as directed. Please refer to the Current Medication list given to you today.  *If you need a refill on your cardiac medications before your next appointment, please call your pharmacy*  Lab Work: NONE   If you have labs (blood work) drawn today and your tests are completely normal, you will receive your results only by: MyChart Message (if you have MyChart) OR A paper copy in the mail If you have any lab test that is abnormal or we need to change your treatment, we will call you to review the results.  Testing/Procedures: Your physician has requested that you have an echocardiogram. Echocardiography is a painless test that uses sound waves to create  images of your heart. It provides your doctor with information about the size and shape of your heart and how well your heart's chambers and valves are working. This procedure takes approximately one hour. There are no restrictions for this procedure. Please do NOT wear cologne, perfume, aftershave, or lotions (deodorant is allowed). Please arrive 15 minutes prior to your appointment time.  Please note: We ask at that you not bring children with you during ultrasound (echo/ vascular) testing. Due to room size and safety concerns, children are not allowed in the ultrasound rooms during exams. Our front office staff cannot provide observation of children in our lobby area while testing is being conducted. An adult accompanying a patient to their appointment will only be allowed in the ultrasound room at the discretion of the ultrasound technician under special circumstances. We apologize for any inconvenience.    Follow-Up: At [facility], you and your health needs are our priority.  As part of our continuing mission to provide you with exceptional heart care, our providers are all part of one team.  This team includes your primary Cardiologist (physician) and Advanced Practice Providers or APPs (Physician Assistants and Nurse Practitioners) who all work together to provide you with the care you need, when you need it.  Your next appointment:   1 Year   Provider:   You may see Constancia Delton, MD or one of the following Advanced Practice Providers on your designated Care Team:   Laneta Pintos, NP Gildardo Labrador, PA-C Varney Gentleman, PA-C Cadence [place], PA-C Ronald Cockayne, NP Morey Ar, NP    We recommend signing up for the patient portal called "MyChart".  Sign up information is provided on this After Visit Summary.  MyChart is used to connect with patients for Virtual Visits (Telemedicine).  Patients are able to view lab/test results, encounter notes, upcoming appointments,  etc.  Non-urgent messages can be sent to  your provider as well.   To learn more about what you can do with MyChart, go to [email or web address].   Other Instructions Thank you for choosing [facility]!           Signed, Constancia Delton, MD  10/30/2023 1:35 PM    [facility]

## 2023-10-30 NOTE — Patient Instructions (Addendum)
 Medication Instructions:  Your physician recommends that you continue on your current medications as directed. Please refer to the Current Medication list given to you today.  *If you need a refill on your cardiac medications before your next appointment, please call your pharmacy*  Lab Work: NONE   If you have labs (blood work) drawn today and your tests are completely normal, you will receive your results only by: MyChart Message (if you have MyChart) OR A paper copy in the mail If you have any lab test that is abnormal or we need to change your treatment, we will call you to review the results.  Testing/Procedures: Your physician has requested that you have an echocardiogram. Echocardiography is a painless test that uses sound waves to create images of your heart. It provides your doctor with information about the size and shape of your heart and how well your heart's chambers and valves are working. This procedure takes approximately one hour. There are no restrictions for this procedure. Please do NOT wear cologne, perfume, aftershave, or lotions (deodorant is allowed). Please arrive 15 minutes prior to your appointment time.  Please note: We ask at that you not bring children with you during ultrasound (echo/ vascular) testing. Due to room size and safety concerns, children are not allowed in the ultrasound rooms during exams. Our front office staff cannot provide observation of children in our lobby area while testing is being conducted. An adult accompanying a patient to their appointment will only be allowed in the ultrasound room at the discretion of the ultrasound technician under special circumstances. We apologize for any inconvenience.    Follow-Up: At [facility], you and your health needs are our priority.  As part of our continuing mission to provide you with exceptional heart care, our providers are all part of one team.  This team includes your primary Cardiologist  (physician) and Advanced Practice Providers or APPs (Physician Assistants and Nurse Practitioners) who all work together to provide you with the care you need, when you need it.  Your next appointment:   1 Year   Provider:   You may see Constancia Delton, MD or one of the following Advanced Practice Providers on your designated Care Team:   Laneta Pintos, NP Gildardo Labrador, PA-C Varney Gentleman, PA-C Cadence [place], PA-C Ronald Cockayne, NP Morey Ar, NP    We recommend signing up for the patient portal called "MyChart".  Sign up information is provided on this After Visit Summary.  MyChart is used to connect with patients for Virtual Visits (Telemedicine).  Patients are able to view lab/test results, encounter notes, upcoming appointments, etc.  Non-urgent messages can be sent to your provider as well.   To learn more about what you can do with MyChart, go to [email or web address].   Other Instructions Thank you for choosing [facility]!

## 2023-11-05 ENCOUNTER — Encounter: Payer: Self-pay | Admitting: Family

## 2023-11-07 ENCOUNTER — Other Ambulatory Visit: Payer: Self-pay | Admitting: Family

## 2023-11-07 DIAGNOSIS — M5442 Lumbago with sciatica, left side: Secondary | ICD-10-CM

## 2023-11-07 MED ORDER — PREDNISONE 10 MG PO TABS: ORAL_TABLET | ORAL | 0 refills | Status: DC

## 2023-11-07 NOTE — Telephone Encounter (Signed)
 Rasheedah, can you provide est of cost for MR lumbar spine?

## 2023-11-18 ENCOUNTER — Other Ambulatory Visit: Payer: Self-pay | Admitting: Family

## 2023-11-18 DIAGNOSIS — F32A Depression, unspecified: Secondary | ICD-10-CM

## 2023-11-18 DIAGNOSIS — M5442 Lumbago with sciatica, left side: Secondary | ICD-10-CM

## 2023-11-18 DIAGNOSIS — R1031 Right lower quadrant pain: Secondary | ICD-10-CM

## 2023-11-25 ENCOUNTER — Ambulatory Visit: Payer: Self-pay | Admitting: Family

## 2023-11-25 ENCOUNTER — Ambulatory Visit
Admission: RE | Admit: 2023-11-25 | Discharge: 2023-11-25 | Disposition: A | Discharge status: Home / Self Care | Source: Ambulatory Visit | Attending: Family | Admitting: Family

## 2023-11-25 DIAGNOSIS — R1031 Right lower quadrant pain: Secondary | ICD-10-CM | POA: Diagnosis not present

## 2023-11-25 DIAGNOSIS — M79605 Pain in left leg: Secondary | ICD-10-CM | POA: Diagnosis not present

## 2023-11-25 DIAGNOSIS — M5442 Lumbago with sciatica, left side: Secondary | ICD-10-CM | POA: Insufficient documentation

## 2023-11-25 DIAGNOSIS — M48061 Spinal stenosis, lumbar region without neurogenic claudication: Secondary | ICD-10-CM | POA: Diagnosis not present

## 2023-11-25 DIAGNOSIS — M4726 Other spondylosis with radiculopathy, lumbar region: Secondary | ICD-10-CM | POA: Diagnosis not present

## 2023-11-25 DIAGNOSIS — D1809 Hemangioma of other sites: Secondary | ICD-10-CM | POA: Diagnosis not present

## 2023-11-28 NOTE — Telephone Encounter (Signed)
 Sent [facility] the MRI from 11/25/23. Spoke to [facility] and asked all of your questions. The girl is sending a message to Dr. Lorenz Romano to find out then the CMA is supposed to call us  back and let us  know.

## 2023-12-02 NOTE — Telephone Encounter (Signed)
Noted will await pt response

## 2023-12-02 NOTE — Telephone Encounter (Signed)
 Copied from CRM [id number]. Topic: General - Other >> Dec 02, 2023 12:04 PM [place] J wrote: Reason for CRM: Patient returning a call back from [place], [place], CMA in regards to MRI paperwork. The patient stated that she will call us  back with what she would like to do since she has found a way to go down to imaging for a CD copy.

## 2023-12-02 NOTE — Telephone Encounter (Signed)
 Lvm for pt to give office a call back. Wanted to know if pt would like us  to mail paper copy to her or if she would like to come by office to pick up

## 2023-12-08 DIAGNOSIS — M5416 Radiculopathy, lumbar region: Secondary | ICD-10-CM | POA: Diagnosis not present

## 2023-12-24 DIAGNOSIS — M5416 Radiculopathy, lumbar region: Secondary | ICD-10-CM | POA: Diagnosis not present

## 2024-01-05 DIAGNOSIS — M5416 Radiculopathy, lumbar region: Secondary | ICD-10-CM | POA: Diagnosis not present

## 2024-01-06 ENCOUNTER — Ambulatory Visit: Payer: Self-pay | Admitting: *Deleted

## 2024-01-06 NOTE — Telephone Encounter (Signed)
      FYI Only or Action Required?: FYI only for provider.  Patient was last seen in primary care on 07/30/2023 by Dineen Rollene MATSU, FNP. Called Nurse Triage reporting Medication question. Symptoms began several weeks ago. Interventions attempted: Prescription medications:  SABRA Symptoms are: gradually worsening.Has on going right leg muscle cramping, asking if she can try a muscle relaxer.  Triage Disposition: Call PCP Now  Patient/caregiver understands and will follow disposition?: YesReason for Disposition  [1] Caller has URGENT medicine question about med that PCP or specialist prescribed AND [2] triager unable to answer question  Answer Assessment - Initial Assessment Questions 1. NAME of MEDICINE: What medicine(s) are you calling about?     Pt. Asking for muscle relaxer, right leg 2. QUESTION: What is your question? (e.g., double dose of medicine, side effect)     Med request 3. PRESCRIBER: Who prescribed the medicine? Reason: if prescribed by specialist, call should be referred to that group.     PCP 4. SYMPTOMS: Do you have any symptoms? If Yes, ask: What symptoms are you having?  How bad are the symptoms (e.g., mild, moderate, severe)     Has bad cramping, can hardly walk 5. PREGNANCY:  Is there any chance that you are pregnant? When was your last menstrual period?     no  Protocols used: Medication Question Call-A-AH

## 2024-01-06 NOTE — Telephone Encounter (Signed)
 Copied from CRM [id number]. Topic: Clinical - Medication Question >> Jan 06, 2024  1:06 PM Jill Ward wrote: Reason for CRM: Patient is still experiencing the muscle spasms and has a follow-up visit scheduled, but she is wondering if there is a muscle relaxer that she could try in the meantime.

## 2024-01-06 NOTE — Telephone Encounter (Signed)
 Called patient to review medication request for muscle spasms until next OV. No answer, LVMTCB [phone]. See previous OV notes from PCP regarding to schedule appt 2nd week in July.

## 2024-01-14 NOTE — Telephone Encounter (Signed)
 LVM and sent pt my chart message as well  to see if she can come in on Friday 7/11 to be seen @ 11:30 per Rollene? Please schedule if opt calls back and document

## 2024-01-14 NOTE — Telephone Encounter (Signed)
 Pt is scheduled 01/26/23 Do we have any sooner appts we can offer? It doesn't appear she was called back after this orginial note and I want to ensure she is okay  Let me know as I can work her in this week if needed such as Friday at 1130 if needed

## 2024-01-14 NOTE — Telephone Encounter (Signed)
 Pt responded though my chart she will be out of town for daughters birthday!

## 2024-01-21 DIAGNOSIS — M5416 Radiculopathy, lumbar region: Secondary | ICD-10-CM | POA: Diagnosis not present

## 2024-01-26 ENCOUNTER — Telehealth: Admitting: Family

## 2024-01-26 VITALS — Ht 65.5 in | Wt 165.2 lb

## 2024-01-26 DIAGNOSIS — R635 Abnormal weight gain: Secondary | ICD-10-CM | POA: Diagnosis not present

## 2024-01-26 DIAGNOSIS — R1031 Right lower quadrant pain: Secondary | ICD-10-CM | POA: Diagnosis not present

## 2024-01-26 MED ORDER — GABAPENTIN 300 MG PO CAPS: 300.0000 mg | ORAL_CAPSULE | Freq: Three times a day (TID) | ORAL | 3 refills | Status: DC

## 2024-01-26 MED ORDER — METFORMIN HCL ER 500 MG PO TB24: 500.0000 mg | ORAL_TABLET | Freq: Every evening | ORAL | Status: AC

## 2024-01-26 NOTE — Progress Notes (Unsigned)
 Virtual Visit via Video Note  I connected with Jill Ward on 01/27/24 at 12:00 PM EDT by a video enabled telemedicine application and verified that I am speaking with the correct person using two identifiers. Location patient: home Location provider: work  Persons participating in the virtual visit: patient, provider  I discussed the limitations of evaluation and management by telemedicine and the availability of in person appointments. The patient expressed understanding and agreed to proceed.  HPI: Right low back and right leg has significantly improved after epidural guided injection a couple of weeks ago.  She is able to get up and off the floor and walk without pain.   Compliant with Gabapentin  200mg  TID, naprosyn 220mg  OTC TID   She is no longer on meloxicam  7.5 mg, Celebrex    Last seen by Dr. Dow 01/05/2024 for lumbar radiculopathy X-rays demonstrate L4-5 small osteosis measuring 7 millimeters on flexion and 2 millimeters on extension. Imaging reveals foraminal stenosis at L4-5.  Discussed surgical options including fusion. 01/21/24 right sided epidural injection  12/08/23 Dr Gini S/p right intraarticular injection with incomplete relief 10/15/23  Discussed possibility of hip replacement, of which he felt she would benefit  ROS: See pertinent positives and negatives per HPI.  X-ray lumbar spine 08/06/2022 mild to space height loss at L2-L3 X-ray bilateral hip 08/06/2022 degenerative changes both hips MRI lumbar spine 11/25/23 Degenerative changes of the lumbar spine as above. No high-grade spinal canal or foraminal stenosis. Small disc bulges resulting in lateral recess narrowing at multiple levels without significant impingement upon the traversing nerve roots EXAM:  VITALS per patient if applicable: Ht 5' 5.5 (1.664 m)   Wt 165 lb 3.2 oz (74.9 kg)   BMI 27.07 kg/m  BP Readings from Last 3 Encounters:  10/30/23 104/64  09/09/23 113/71  07/30/23 118/78   Wt  Readings from Last 3 Encounters:  01/26/24 165 lb 3.2 oz (74.9 kg)  10/30/23 163 lb 6 oz (74.1 kg)  11/25/23 163 lb (73.9 kg)    GENERAL: alert, oriented, appears well and in no acute distress  HEENT: atraumatic, conjunttiva clear, no obvious abnormalities on inspection of external nose and ears  NECK: normal movements of the head and neck  LUNGS: on inspection no signs of respiratory distress, breathing rate appears normal, no obvious gross SOB, gasping or wheezing  CV: no obvious cyanosis  MS: moves all visible extremities without noticeable abnormality  PSYCH/NEURO: pleasant and cooperative, no obvious depression or anxiety, speech and thought processing grossly intact  ASSESSMENT AND PLAN: Right groin pain Assessment & Plan: Symptoms completely resolved after epidural guided injection, 01/21/2024. We discussed continued exercise and strength building.  Advised patient to increase gabapentin  from 200 mg 3 times daily to 300 mg 3 times daily and to begin to decrease naproxen.  Discussed concern for PUD, gastritis and to limit use of naproxen.  She is taking omeprazole  couple of days per week.   She will let me know how she is doing we will further titrate gabapentin .    Weight gain -     metFORMIN  HCl ER; Take 1 tablet (500 mg total) by mouth every evening.  Other orders -     Gabapentin ; Take 1 capsule (300 mg total) by mouth 3 (three) times daily.  Dispense: 270 capsule; Refill: 3     -we discussed possible serious and likely etiologies, options for evaluation and workup, limitations of telemedicine visit vs in person visit, treatment, treatment risks and precautions. Pt prefers to  treat via telemedicine empirically rather then risking or undertaking an in person visit at this moment.    I discussed the assessment and treatment plan with the patient. The patient was provided an opportunity to ask questions and all were answered. The patient agreed with the plan and  demonstrated an understanding of the instructions.   The patient was advised to call back or seek an in-person evaluation if the symptoms worsen or if the condition fails to improve as anticipated.  Advised if desired AVS can be mailed or viewed via MyChart if Mychart user.   Rollene [place], FNP

## 2024-01-26 NOTE — Patient Instructions (Signed)
 I am so grateful that you are feeling so much better.  Please increase gabapentin  to 300 mg 3 times per day.  We can further increase using the 100 mg tablets that you have at home to titrate.   Please monitor for sedation.    Low Back Sprain or Strain Rehab Ask your health care provider which exercises are safe for you. Do exercises exactly as told by your health care provider and adjust them as directed. It is normal to feel mild stretching, pulling, tightness, or discomfort as you do these exercises. Stop right away if you feel sudden pain or your pain gets worse. Do not begin these exercises until told by your health care provider. Stretching and range-of-motion exercises These exercises warm up your muscles and joints and improve the movement and flexibility of your back. These exercises also help to relieve pain, numbness, and tingling. Lumbar rotation  Lie on your back on a firm bed or the floor with your knees bent. Straighten your arms out to your sides so each arm forms a 90-degree angle (right angle) with a side of your body. Slowly move (rotate) both of your knees to one side of your body until you feel a stretch in your lower back (lumbar). Try not to let your shoulders lift off the floor. Hold this position for __________ seconds. Tense your abdominal muscles and slowly move your knees back to the starting position. Repeat this exercise on the other side of your body. Repeat __________ times. Complete this exercise __________ times a day. Single knee to chest  Lie on your back on a firm bed or the floor with both legs straight. Bend one of your knees. Use your hands to move your knee up toward your chest until you feel a gentle stretch in your lower back and buttock. Hold your leg in this position by holding on to the front of your knee. Keep your other leg as straight as possible. Hold this position for __________ seconds. Slowly return to the starting position. Repeat  with your other leg. Repeat __________ times. Complete this exercise __________ times a day. Prone extension on elbows  Lie on your abdomen on a firm bed or the floor (prone position). Prop yourself up on your elbows. Use your arms to help lift your chest up until you feel a gentle stretch in your abdomen and your lower back. This will place some of your body weight on your elbows. If this is uncomfortable, try stacking pillows under your chest. Your hips should stay down, against the surface that you are lying on. Keep your hip and back muscles relaxed. Hold this position for __________ seconds. Slowly relax your upper body and return to the starting position. Repeat __________ times. Complete this exercise __________ times a day. Strengthening exercises These exercises build strength and endurance in your back. Endurance is the ability to use your muscles for a long time, even after they get tired. Pelvic tilt This exercise strengthens the muscles that lie deep in the abdomen. Lie on your back on a firm bed or the floor with your legs extended. Bend your knees so they are pointing toward the ceiling and your feet are flat on the floor. Tighten your lower abdominal muscles to press your lower back against the floor. This motion will tilt your pelvis so your tailbone points up toward the ceiling instead of pointing to your feet or the floor. To help with this exercise, you may place a small towel under  your lower back and try to push your back into the towel. Hold this position for __________ seconds. Let your muscles relax completely before you repeat this exercise. Repeat __________ times. Complete this exercise __________ times a day. Alternating arm and leg raises  Get on your hands and knees on a firm surface. If you are on a hard floor, you may want to use padding, such as an exercise mat, to cushion your knees. Line up your arms and legs. Your hands should be directly below your  shoulders, and your knees should be directly below your hips. Lift your left leg behind you. At the same time, raise your right arm and straighten it in front of you. Do not lift your leg higher than your hip. Do not lift your arm higher than your shoulder. Keep your abdominal and back muscles tight. Keep your hips facing the ground. Do not arch your back. Keep your balance carefully, and do not hold your breath. Hold this position for __________ seconds. Slowly return to the starting position. Repeat with your right leg and your left arm. Repeat __________ times. Complete this exercise __________ times a day. Abdominal set with straight leg raise  Lie on your back on a firm bed or the floor. Bend one of your knees and keep your other leg straight. Tense your abdominal muscles and lift your straight leg up, 4-6 inches (10-15 cm) off the ground. Keep your abdominal muscles tight and hold this position for __________ seconds. Do not hold your breath. Do not arch your back. Keep it flat against the ground. Keep your abdominal muscles tense as you slowly lower your leg back to the starting position. Repeat with your other leg. Repeat __________ times. Complete this exercise __________ times a day. Single leg lower with bent knees Lie on your back on a firm bed or the floor. Tense your abdominal muscles and lift your feet off the floor, one foot at a time, so your knees and hips are bent in 90-degree angles (right angles). Your knees should be over your hips and your lower legs should be parallel to the floor. Keeping your abdominal muscles tense and your knee bent, slowly lower one of your legs so your toe touches the ground. Lift your leg back up to return to the starting position. Do not hold your breath. Do not let your back arch. Keep your back flat against the ground. Repeat with your other leg. Repeat __________ times. Complete this exercise __________ times a day. Posture and body  mechanics Good posture and healthy body mechanics can help to relieve stress in your body's tissues and joints. Body mechanics refers to the movements and positions of your body while you do your daily activities. Posture is part of body mechanics. Good posture means: Your spine is in its natural S-curve position (neutral). Your shoulders are pulled back slightly. Your head is not tipped forward (neutral). Follow these guidelines to improve your posture and body mechanics in your everyday activities. Standing  When standing, keep your spine neutral and your feet about hip-width apart. Keep a slight bend in your knees. Your ears, shoulders, and hips should line up. When you do a task in which you stand in one place for a long time, place one foot up on a stable object that is 2-4 inches (5-10 cm) high, such as a footstool. This helps keep your spine neutral. Sitting  When sitting, keep your spine neutral and keep your feet flat on the floor.  Use a footrest, if necessary, and keep your thighs parallel to the floor. Avoid rounding your shoulders, and avoid tilting your head forward. When working at a desk or a computer, keep your desk at a height where your hands are slightly lower than your elbows. Slide your chair under your desk so you are close enough to maintain good posture. When working at a computer, place your monitor at a height where you are looking straight ahead and you do not have to tilt your head forward or downward to look at the screen. Resting When lying down and resting, avoid positions that are most painful for you. If you have pain with activities such as sitting, bending, stooping, or squatting, lie in a position in which your body does not bend very much. For example, avoid curling up on your side with your arms and knees near your chest (fetal position). If you have pain with activities such as standing for a long time or reaching with your arms, lie with your spine in a  neutral position and bend your knees slightly. Try the following positions: Lying on your side with a pillow between your knees. Lying on your back with a pillow under your knees. Lifting  When lifting objects, keep your feet at least shoulder-width apart and tighten your abdominal muscles. Bend your knees and hips and keep your spine neutral. It is important to lift using the strength of your legs, not your back. Do not lock your knees straight out. Always ask for help to lift heavy or awkward objects. This information is not intended to replace advice given to you by your health care provider. Make sure you discuss any questions you have with your health care provider. Document Revised: 10/28/2022 Document Reviewed: 09/11/2020 Elsevier Patient Education  2024 [employer].

## 2024-01-27 NOTE — Assessment & Plan Note (Signed)
 Symptoms completely resolved after epidural guided injection, 01/21/2024. We discussed continued exercise and strength building.  Advised patient to increase gabapentin  from 200 mg 3 times daily to 300 mg 3 times daily and to begin to decrease naproxen.  Discussed concern for PUD, gastritis and to limit use of naproxen.  She is taking omeprazole  couple of days per week.   She will let me know how she is doing we will further titrate gabapentin .

## 2024-03-19 DIAGNOSIS — M5416 Radiculopathy, lumbar region: Secondary | ICD-10-CM | POA: Diagnosis not present

## 2024-03-22 ENCOUNTER — Encounter: Payer: Self-pay | Admitting: Family

## 2024-03-23 ENCOUNTER — Other Ambulatory Visit: Payer: Self-pay | Admitting: Family

## 2024-03-23 DIAGNOSIS — M5442 Lumbago with sciatica, left side: Secondary | ICD-10-CM

## 2024-03-23 MED ORDER — PREDNISONE 10 MG PO TABS: ORAL_TABLET | ORAL | 0 refills | Status: DC

## 2024-04-08 DIAGNOSIS — M5416 Radiculopathy, lumbar region: Secondary | ICD-10-CM | POA: Diagnosis not present

## 2024-04-08 DIAGNOSIS — M7918 Myalgia, other site: Secondary | ICD-10-CM | POA: Diagnosis not present

## 2024-04-09 ENCOUNTER — Other Ambulatory Visit

## 2024-04-30 DIAGNOSIS — M5416 Radiculopathy, lumbar region: Secondary | ICD-10-CM | POA: Diagnosis not present

## 2024-05-07 NOTE — Progress Notes (Signed)
 Jill Ward                                          MRN: [id number]   05/07/2024   The [facility] Quality Team Specialist reviewed this patient medical record for the purposes of chart review for care gap closure. The following were reviewed: chart review for care gap closure-colorectal cancer screening.    [facility] Quality Team

## 2024-05-21 ENCOUNTER — Other Ambulatory Visit

## 2024-05-24 ENCOUNTER — Encounter: Payer: Self-pay | Admitting: Family

## 2024-05-26 NOTE — Telephone Encounter (Signed)
 Scheduled pt for 06/10/24 for MCVV

## 2024-05-31 ENCOUNTER — Other Ambulatory Visit: Payer: Self-pay | Admitting: Family

## 2024-06-10 ENCOUNTER — Telehealth: Admitting: Family

## 2024-06-10 ENCOUNTER — Encounter: Payer: Self-pay | Admitting: Family

## 2024-06-10 VITALS — Ht 65.0 in | Wt 165.0 lb

## 2024-06-10 DIAGNOSIS — R1031 Right lower quadrant pain: Secondary | ICD-10-CM

## 2024-06-10 DIAGNOSIS — M5442 Lumbago with sciatica, left side: Secondary | ICD-10-CM

## 2024-06-10 MED ORDER — PREDNISONE 10 MG PO TABS: ORAL_TABLET | ORAL | 0 refills | Status: DC

## 2024-06-10 MED ORDER — GABAPENTIN 300 MG PO CAPS: 300.0000 mg | ORAL_CAPSULE | Freq: Every day | ORAL | Status: AC

## 2024-06-10 NOTE — Progress Notes (Signed)
 Virtual Visit via Video Note  I connected with Jill Ward on 06/10/24 at  9:00 AM EST by a video enabled telemedicine application and verified that I am speaking with the correct person using two identifiers. Location patient: home Location provider: work  Persons participating in the virtual visit: patient, provider  I discussed the limitations of evaluation and management by telemedicine and the availability of in person appointments. The patient expressed understanding and agreed to proceed.  HPI: Complains of continued right leg thigh pain , worse the last several weeks.   Pain can radiate to right knee and right ankle.  The 'fire' in right leg has resolved with ESI.   Endorses low back pain. She is limping due to the pain.  Endorses cracking sound in her right hip with movement.  No falls. Denied groin pain, urinary or fecal incontinence, saddle anesthesia  She is interested in  second opinion.  Prednisone  helps temporarily   ESI lumbar 04/30/24 without relief  Compliant with Gabapentin  300 mg nightly (self reduced due to sedation), meloxicam  7.5mg  1-2 times daily  She is compliant with omeprazole .    Seen [facility] 04/08/24 1) Order repeat [place] ESI L5/S1 ( Rt paramedian) ( DX: Lumbar spinal stenosis/radiculopathy) Pt has done remarkably well for 3 months, improvement in function, less sharp pain down leg.  2) Add flexeril at night for muscle spasms as needed  2)F/U in a few months after ESI. Consider re-referral to PT for gait training.   Dr. Dow 03/19/24 , 01/05/2024 for lumbar radiculopathy X-rays demonstrate L4-5 small osteosis measuring 7 millimeters on flexion and 2 millimeters on extension. Imaging reveals foraminal stenosis at L4-5.  Discussed surgical options including fusion. 01/21/24 right sided epidural injection   12/08/23 Dr Gini S/p right intraarticular injection with incomplete relief 10/15/23  Discussed possibility of hip replacement, of which he  felt she would benefit  08/07/22 DG pelvis  Degenerative changes about both hips and the pubic symphysis. No acute fracture.  Normal DEXA  ROS: See pertinent positives and negatives per HPI.  EXAM:  VITALS per patient if applicable: Ht 5' 5 (1.651 m)   Wt 165 lb (74.8 kg)   BMI 27.46 kg/m  BP Readings from Last 3 Encounters:  10/30/23 104/64  09/09/23 113/71  07/30/23 118/78   Wt Readings from Last 3 Encounters:  06/10/24 165 lb (74.8 kg)  01/26/24 165 lb 3.2 oz (74.9 kg)  10/30/23 163 lb 6 oz (74.1 kg)    GENERAL: alert, oriented, appears well and in no acute distress  HEENT: atraumatic, conjunttiva clear, no obvious abnormalities on inspection of external nose and ears  NECK: normal movements of the head and neck  LUNGS: on inspection no signs of respiratory distress, breathing rate appears normal, no obvious gross SOB, gasping or wheezing  CV: no obvious cyanosis  MS: moves all visible extremities without noticeable abnormality  PSYCH/NEURO: pleasant and cooperative, no obvious depression or anxiety, speech and thought processing grossly intact  ASSESSMENT AND PLAN: Left-sided low back pain with left-sided sciatica, unspecified chronicity Assessment & Plan: Low back and right leg pain.  Discussed most likely lumbar in etiology with radicular however not entirely convinced hip pathology is not present; prior pelvic x-ray with degenerative changes.  Failed ESI 04/30/2024.  Continue gabapentin  300 mg nightly, meloxicam  7.5 mg once to twice daily.  Reiterated the importance of staying on omeprazole  for gastric protection. Pending dedicated right pelvis and femur x-ray.  Consider right hip MRI.  Referral to Dr.  Lateef for second opinion.  I did provide prednisone  taper for relief if symptoms escalate for her to use at her discretion as we approach the holidays due to pain.   Orders: -     Gabapentin ; Take 1 capsule (300 mg total) by mouth at bedtime. -     DG HIP  UNILAT W OR W/O PELVIS 1V RIGHT; Future -     DG FEMUR 1V RIGHT; Future -     Ambulatory referral to Pain Clinic -     predniSONE ; Take 40 mg by mouth on day 1, then taper 10 mg daily until gone  Dispense: 10 tablet; Refill: 0  Right groin pain -     DG HIP UNILAT W OR W/O PELVIS 1V RIGHT; Future -     DG FEMUR 1V RIGHT; Future -     Ambulatory referral to Pain Clinic -     predniSONE ; Take 40 mg by mouth on day 1, then taper 10 mg daily until gone  Dispense: 10 tablet; Refill: 0     -we discussed possible serious and likely etiologies, options for evaluation and workup, limitations of telemedicine visit vs in person visit, treatment, treatment risks and precautions. Pt prefers to treat via telemedicine empirically rather then risking or undertaking an in person visit at this moment.    I discussed the assessment and treatment plan with the patient. The patient was provided an opportunity to ask questions and all were answered. The patient agreed with the plan and demonstrated an understanding of the instructions.   The patient was advised to call back or seek an in-person evaluation if the symptoms worsen or if the condition fails to improve as anticipated.  Advised if desired AVS can be mailed or viewed via MyChart if Mychart user.   Rollene Northern, FNP

## 2024-06-10 NOTE — Patient Instructions (Signed)
 Referral to Dr Marcelino, [facility], pain management  Provided a refill of prednisone  to use if needed over the holidays. Please continue Meprazole to protect your stomach while you are on meloxicam .  Please let me know if you need anything at all

## 2024-06-10 NOTE — Assessment & Plan Note (Addendum)
 Low back and right leg pain.  Discussed most likely lumbar in etiology with radicular however not entirely convinced hip pathology is not present; prior pelvic x-ray with degenerative changes.  Failed ESI 04/30/2024.  Continue gabapentin  300 mg nightly, meloxicam  7.5 mg once to twice daily.  Reiterated the importance of staying on omeprazole  for gastric protection. Pending dedicated right pelvis and femur x-ray.  Consider right hip MRI.  Referral to Dr. Marcelino for second opinion.  I did provide prednisone  taper for relief if symptoms escalate for her to use at her discretion as we approach the holidays due to pain.

## 2024-06-14 ENCOUNTER — Encounter: Payer: Self-pay | Admitting: Family

## 2024-06-14 ENCOUNTER — Other Ambulatory Visit

## 2024-06-14 ENCOUNTER — Ambulatory Visit

## 2024-06-14 DIAGNOSIS — R1031 Right lower quadrant pain: Secondary | ICD-10-CM | POA: Diagnosis not present

## 2024-06-14 DIAGNOSIS — M79604 Pain in right leg: Secondary | ICD-10-CM | POA: Diagnosis not present

## 2024-06-14 DIAGNOSIS — M5442 Lumbago with sciatica, left side: Secondary | ICD-10-CM

## 2024-06-14 NOTE — Telephone Encounter (Signed)
 Spoke to pt and rescheduled appt to this afternoon on 06/14/24

## 2024-06-20 ENCOUNTER — Ambulatory Visit: Payer: Self-pay | Admitting: Family

## 2024-06-20 ENCOUNTER — Telehealth: Payer: Self-pay | Admitting: Family

## 2024-06-20 DIAGNOSIS — M25551 Pain in right hip: Secondary | ICD-10-CM

## 2024-06-20 NOTE — Telephone Encounter (Signed)
 We need CD of hip and femur right side from 06/14/24 burned  Pt is coming on 06/21/24 for labs and will pick up to take with her to orthopedic appt

## 2024-06-20 NOTE — Telephone Encounter (Signed)
° °  Please schedule labs , non fasting, 06/21/24   I have ordered   Telephone note   Reviewed right hip, femur xray with pt this morning.   Discussed concern for acute changes in right hip when compared to hip xr 08/06/2022 No acute fracture.  Severe joint space narrowing with subcortical cyst formation and sclerosis of the right hip.  This is new. She  Denies fever, chills, joint swelling, heat from joint. She does note however that right thigh was noted to be larger than left by family member.  She has not noticed this herself  She is not gabapentin  due to concern for hair loss.  She hass not started prednisone  taper.  She is taking Flexeril 5 mg with relief at night.  She continues take meloxicam  7.5 mg daily.  I called and spoke with [facility] CMA Cassandra.  On-call provider is not able to see x-rays from our office.  Discussed findings with CMA.    Patient was scheduled with Dr. Nappo 06/22/2024 11:40 AM.  Presentation more consistent with arthritic than septic arthritis.  However we jointly agreed to pursue inflammatory labs, close vigilance.

## 2024-06-21 ENCOUNTER — Telehealth: Payer: Self-pay

## 2024-06-21 ENCOUNTER — Other Ambulatory Visit (INDEPENDENT_AMBULATORY_CARE_PROVIDER_SITE_OTHER)

## 2024-06-21 DIAGNOSIS — M25551 Pain in right hip: Secondary | ICD-10-CM

## 2024-06-21 NOTE — Telephone Encounter (Signed)
 [facility] called to give report on pt xrays, read in pt chart

## 2024-06-21 NOTE — Telephone Encounter (Signed)
 Call [facility], Dr gini  Pt seeing him tomorrow for worsening right hip pain She is bring CD of right hip Xray repeated 06/14/24  Please fax femur and pelvis xray to Dr Gini  I'm concerned for severe joint space narrowing ,  Cyst and sclerosis right hip  I  have ordered autoimmune , inflammatory labs (pending)

## 2024-06-21 NOTE — Telephone Encounter (Signed)
 Called [facility], got their fax number [phone] faxed over info for pt appt on 06/22/24

## 2024-06-21 NOTE — Telephone Encounter (Signed)
 Pt has been scheduled.

## 2024-06-22 ENCOUNTER — Ambulatory Visit: Payer: Self-pay | Admitting: Family

## 2024-06-22 DIAGNOSIS — M1611 Unilateral primary osteoarthritis, right hip: Secondary | ICD-10-CM | POA: Diagnosis not present

## 2024-06-22 LAB — SEDIMENTATION RATE: Sed Rate: 29 mm/h (ref 0–30)

## 2024-06-22 LAB — CBC WITH DIFFERENTIAL/PLATELET
Basophils Absolute: 0.1 K/uL (ref 0.0–0.1)
Basophils Relative: 1.1 % (ref 0.0–3.0)
Eosinophils Absolute: 0.1 K/uL (ref 0.0–0.7)
Eosinophils Relative: 2.2 % (ref 0.0–5.0)
HCT: 36.9 % (ref 36.0–46.0)
Hemoglobin: 12.5 g/dL (ref 12.0–15.0)
Lymphocytes Relative: 28.8 % (ref 12.0–46.0)
Lymphs Abs: 1.8 K/uL (ref 0.7–4.0)
MCHC: 34 g/dL (ref 30.0–36.0)
MCV: 90.5 fl (ref 78.0–100.0)
Monocytes Absolute: 0.5 K/uL (ref 0.1–1.0)
Monocytes Relative: 7.6 % (ref 3.0–12.0)
Neutro Abs: 3.8 K/uL (ref 1.4–7.7)
Neutrophils Relative %: 60.3 % (ref 43.0–77.0)
Platelets: 348 K/uL (ref 150.0–400.0)
RBC: 4.08 Mil/uL (ref 3.87–5.11)
RDW: 13.6 % (ref 11.5–15.5)
WBC: 6.2 K/uL (ref 4.0–10.5)

## 2024-06-22 LAB — ANA: Anti Nuclear Antibody (ANA): NEGATIVE

## 2024-06-22 LAB — RHEUMATOID FACTOR: Rheumatoid fact SerPl-aCnc: <10 [IU]/mL (ref <14)

## 2024-06-22 LAB — C-REACTIVE PROTEIN: CRP: <0.5 mg/dL — ABNORMAL LOW (ref 1.0–20.0)

## 2024-06-22 NOTE — Telephone Encounter (Signed)
 FYI... cd has been picked up by the pt.

## 2024-06-23 LAB — CYCLIC CITRUL PEPTIDE ANTIBODY, IGG/IGA: Cyclic Citrullin Peptide Ab: 8 U (ref 0–19)

## 2024-06-25 ENCOUNTER — Other Ambulatory Visit: Payer: Self-pay | Admitting: Family

## 2024-06-25 NOTE — Telephone Encounter (Unsigned)
 Copied from CRM #[id number]. Topic: Clinical - Medication Refill >> Jun 25, 2024  3:12 PM Victoria A wrote: Medication: cyclobenzaprine  (FLEXERIL ) 5 MG tablet  Has the patient contacted their pharmacy? Yes (Agent: If no, request that the patient contact the pharmacy for the refill. If patient does not wish to contact the pharmacy document the reason why and proceed with request.) (Agent: If yes, when and what did the pharmacy advise?)Call Primary  This is the patient's preferred pharmacy:  [facility] - [place], [place] - [address] [place] [address] Phone: [phone] Fax: [phone]  Is this the correct pharmacy for this prescription? Yes If no, delete pharmacy and type the correct one.   Has the prescription been filled recently? No  Is the patient out of the medication? Yes  Has the patient been seen for an appointment in the last year OR does the patient have an upcoming appointment? Yes  Can we respond through MyChart? Yes  Agent: Please be advised that Rx refills may take up to 3 business days. We ask that you follow-up with your pharmacy.

## 2024-06-26 ENCOUNTER — Telehealth: Payer: Self-pay | Admitting: Family

## 2024-06-26 MED ORDER — CYCLOBENZAPRINE HCL 5 MG PO TABS: 5.0000 mg | ORAL_TABLET | Freq: Three times a day (TID) | ORAL | 1 refills | Status: AC | PRN

## 2024-06-26 NOTE — Telephone Encounter (Signed)
 Spoke to pt She would like refill of flexeril  to take prn She has hip surgery sch with dr gini next month

## 2024-07-12 ENCOUNTER — Telehealth: Payer: Self-pay

## 2024-07-12 NOTE — Telephone Encounter (Signed)
 Copied from CRM [id number]. Topic: Clinical - Medication Question >> Jul 12, 2024  3:59 PM Charolett L wrote: Reason for CRM: Patient called in and stated that she was adv that she can't take certain medications prior to her hip surgery on  01/12. Patient needs a clarity to know if she can take citalopram  (CELEXA ) 20 MG tablet and wants a call back

## 2024-07-14 NOTE — Telephone Encounter (Signed)
 Spoke to pt she has spoken to Surgeon and they recommended she not take it the night before her surgery

## 2024-07-19 HISTORY — PX: HIP SURGERY: SHX245

## 2024-07-26 ENCOUNTER — Encounter: Payer: Self-pay | Admitting: Family

## 2024-07-26 NOTE — Telephone Encounter (Signed)
 noted

## 2024-08-24 ENCOUNTER — Ambulatory Visit

## 2024-10-29 ENCOUNTER — Ambulatory Visit: Admitting: Cardiology
